# Patient Record
Sex: Female | Born: 1990 | Race: White | Hispanic: Yes | Marital: Married | Smoking: Never smoker
Health system: Southern US, Community
[De-identification: ages and names within clinical notes are randomized; demographics above are authoritative.]

## PROBLEM LIST (undated history)

## (undated) ENCOUNTER — Inpatient Hospital Stay (HOSPITAL_COMMUNITY): Payer: Self-pay

## (undated) DIAGNOSIS — Z789 Other specified health status: Secondary | ICD-10-CM

---

## 2016-12-06 DIAGNOSIS — Z98891 History of uterine scar from previous surgery: Secondary | ICD-10-CM | POA: Insufficient documentation

## 2017-07-23 NOTE — L&D Delivery Note (Addendum)
Delivery Note At 10:38 PM a viable and healthy female was delivered via Vaginal, Spontaneous (Presentation: ROA).  APGAR: 9 ,9 ; weight pending.   Placenta status: spontaneous,intact .  Cord: 3 vessel with the following complications: none .    Anesthesia:  none Episiotomy: None Lacerations:  intact Suture Repair: n/a Est. Blood Loss (mL):  150 ml  Mom to postpartum.  Baby to Couplet care / Skin to Skin.  Sally Peterson is a 27 y.o. female [phone] with IUP at 1017w5d admitted for active labor at term.  She progressed without augmentation to complete and pushed less than 30 minutes to deliver.  While pushing, pt temp was 101.0. IV fluid bolus given and delivery occurred within a few minutes of maternal temp.  Cord clamping delayed by 1 minute then clamped by CNM and cut by family member.  Placenta intact and spontaneous, bleeding minimal. Intact perineum.  Mom and baby stable prior to transfer to postpartum. She plans on breastfeeding. She requests Nexplanon for birth control.   Sharen CounterLisa Leftwich-Kirby 02/24/2018, 10:55 PM

## 2017-09-01 ENCOUNTER — Emergency Department (HOSPITAL_COMMUNITY): Payer: Self-pay

## 2017-09-01 ENCOUNTER — Emergency Department (HOSPITAL_COMMUNITY)
Admission: EM | Admit: 2017-09-01 | Discharge: 2017-09-02 | Disposition: A | Discharge status: Home / Self Care | Payer: Self-pay | Attending: Physician Assistant | Admitting: Physician Assistant

## 2017-09-01 ENCOUNTER — Encounter (HOSPITAL_COMMUNITY): Payer: Self-pay | Admitting: Emergency Medicine

## 2017-09-01 DIAGNOSIS — O9989 Other specified diseases and conditions complicating pregnancy, childbirth and the puerperium: Secondary | ICD-10-CM | POA: Insufficient documentation

## 2017-09-01 DIAGNOSIS — E876 Hypokalemia: Secondary | ICD-10-CM | POA: Insufficient documentation

## 2017-09-01 DIAGNOSIS — Z3A14 14 weeks gestation of pregnancy: Secondary | ICD-10-CM | POA: Insufficient documentation

## 2017-09-01 DIAGNOSIS — R1030 Lower abdominal pain, unspecified: Secondary | ICD-10-CM | POA: Insufficient documentation

## 2017-09-01 DIAGNOSIS — N898 Other specified noninflammatory disorders of vagina: Secondary | ICD-10-CM | POA: Insufficient documentation

## 2017-09-01 DIAGNOSIS — O99282 Endocrine, nutritional and metabolic diseases complicating pregnancy, second trimester: Secondary | ICD-10-CM | POA: Insufficient documentation

## 2017-09-01 LAB — COMPREHENSIVE METABOLIC PANEL
ALBUMIN: 3.7 g/dL (ref 3.5–5.0)
ALT: 14 U/L (ref 14–54)
AST: 21 U/L (ref 15–41)
Alkaline Phosphatase: 60 U/L (ref 38–126)
Anion gap: 12 (ref 5–15)
BILIRUBIN TOTAL: 0.4 mg/dL (ref 0.3–1.2)
CALCIUM: 8.8 mg/dL — AB (ref 8.9–10.3)
CO2: 20 mmol/L — ABNORMAL LOW (ref 22–32)
Chloride: 104 mmol/L (ref 101–111)
Creatinine, Ser: 0.46 mg/dL (ref 0.44–1.00)
GFR calc Af Amer: >60 mL/min (ref >60)
GFR calc non Af Amer: >60 mL/min (ref >60)
GLUCOSE: 90 mg/dL (ref 65–99)
Potassium: 3.1 mmol/L — ABNORMAL LOW (ref 3.5–5.1)
Sodium: 136 mmol/L (ref 135–145)
TOTAL PROTEIN: 7.1 g/dL (ref 6.5–8.1)

## 2017-09-01 LAB — OB RESULTS CONSOLE HGB/HCT, BLOOD
HCT: 35
HEMOGLOBIN: 11.8

## 2017-09-01 LAB — URINALYSIS, ROUTINE W REFLEX MICROSCOPIC
BILIRUBIN URINE: NEGATIVE
Glucose, UA: NEGATIVE mg/dL
HGB URINE DIPSTICK: NEGATIVE
KETONES UR: 80 mg/dL — AB
Leukocytes, UA: NEGATIVE
NITRITE: NEGATIVE
PH: 5 (ref 5.0–8.0)
Protein, ur: NEGATIVE mg/dL
Specific Gravity, Urine: 1.018 (ref 1.005–1.030)

## 2017-09-01 LAB — LIPASE, BLOOD: Lipase: 23 U/L (ref 11–51)

## 2017-09-01 LAB — CBC
HEMATOCRIT: 35.3 % — AB (ref 36.0–46.0)
Hemoglobin: 11.8 g/dL — ABNORMAL LOW (ref 12.0–15.0)
MCH: 29 pg (ref 26.0–34.0)
MCHC: 33.4 g/dL (ref 30.0–36.0)
MCV: 86.7 fL (ref 78.0–100.0)
Platelets: 316 10*3/uL (ref 150–400)
RBC: 4.07 MIL/uL (ref 3.87–5.11)
RDW: 13.4 % (ref 11.5–15.5)
WBC: 11.5 10*3/uL — ABNORMAL HIGH (ref 4.0–10.5)

## 2017-09-01 LAB — HCG, QUANTITATIVE, PREGNANCY: hCG, Beta Chain, Quant, S: 47683 m[IU]/mL — ABNORMAL HIGH (ref <5)

## 2017-09-01 LAB — OB RESULTS CONSOLE PLATELET COUNT: PLATELETS: 316

## 2017-09-01 MED ORDER — SODIUM CHLORIDE 0.9 % IV BOLUS (SEPSIS)
500.0000 mL | Freq: Once | INTRAVENOUS | Status: AC
  Administered 2017-09-01: 500 mL via INTRAVENOUS

## 2017-09-01 MED ORDER — POTASSIUM CHLORIDE CRYS ER 20 MEQ PO TBCR
40.0000 meq | EXTENDED_RELEASE_TABLET | Freq: Once | ORAL | Status: AC
  Administered 2017-09-01: 40 meq via ORAL
  Filled 2017-09-01: qty 2

## 2017-09-01 NOTE — ED Provider Notes (Signed)
[facility] EMERGENCY DEPARTMENT Provider Note   CSN: [id number] Arrival date & time: 09/01/17  2019     History   Chief Complaint Chief Complaint  Patient presents with  . Abdominal Pain    HPI Sally Peterson is a 27 y.o. female [phone] with no past medical history presenting with 1 day of lower abdominal cramping bilaterally wrapping around her back with associated nausea vomiting today.  Patient reports a home pregnancy positive test at home .  LMP was 05/06/17.  She has not yet had her initial OB visit.  She just moved to [facility] and has an appointment scheduled on 09/10/17 She denies any vaginal bleeding or  discharge.  No focal lateral pain.  Describes the pain similar to menstrual cramps.  Denies any fever, chills.  HPI  History reviewed. No pertinent past medical history.  There are no active problems to display for this patient.   Past Surgical History:  Procedure Laterality Date  . CESAREAN SECTION      OB History    Gravida Para Term Preterm AB Living   1             SAB TAB Ectopic Multiple Live Births                   Home Medications    Prior to Admission medications   Medication Sig Start Date End Date Taking? Authorizing Provider  Prenatal Vit-Fe Fumarate-FA (PRENATAL COMPLETE) 14-0.4 MG TABS Take 1 capsule by mouth daily. 09/02/17   Georgiana Shore, PA-C    Family History No family history on file.  Social History Social History   Tobacco Use  . Smoking status: Never Smoker  . Smokeless tobacco: Never Used  Substance Use Topics  . Alcohol use: No    Frequency: Never  . Drug use: No     Allergies   Penicillins   Review of Systems Review of Systems  Constitutional: Negative for chills, diaphoresis, fatigue and fever.  Respiratory: Negative for cough, chest tightness, shortness of breath, wheezing and stridor.   Cardiovascular: Negative for chest pain and palpitations.  Gastrointestinal: Positive  for nausea and vomiting. Negative for abdominal pain, blood in stool and diarrhea.  Genitourinary: Positive for pelvic pain. Negative for difficulty urinating, dysuria, frequency, hematuria, urgency, vaginal bleeding, vaginal discharge and vaginal pain.       She reports a pressure sensation after she urinates sensitive baby was pushing down  Musculoskeletal: Negative for arthralgias, back pain, myalgias, neck pain and neck stiffness.  Skin: Negative for color change, pallor and rash.  Neurological: Negative for dizziness, seizures, syncope, weakness, light-headedness and headaches.     Physical Exam Updated Vital Signs BP 129/75   Pulse (!) 108   Temp 97.9 F (36.6 C) (Oral)   Resp 16   Ht 5\' 2"  (1.575 m)   Wt 49.9 kg (110 lb)   LMP 06/06/2017   SpO2 100%   BMI 20.12 kg/m   Physical Exam  Constitutional: She appears well-developed and well-nourished.  Non-toxic appearance. She does not appear ill. No distress.  Well-appearing, nontoxic afebrile sitting comfortably in bed no acute distress.  HENT:  Head: Normocephalic and atraumatic.  Eyes: Conjunctivae are normal.  Neck: Neck supple.  Cardiovascular: Normal rate and regular rhythm.  No murmur heard. Pulmonary/Chest: Effort normal and breath sounds normal. No stridor. No respiratory distress. She has no wheezes. She has no rales. She exhibits no tenderness.  Abdominal: Soft. Normal appearance and  bowel sounds are normal. There is tenderness. There is no rigidity, no rebound, no guarding, no CVA tenderness and no tenderness at McBurney's point.  Mild discomfort to palpation of the suprapubic area and lower abdomen bilaterally  Genitourinary: Vaginal discharge found.  Genitourinary Comments: Os is closed. Copious milky white discharge and cervix mildly friable. No cervical motion tenderness  Musculoskeletal: She exhibits no edema.  Neurological: She is alert.  Skin: Skin is warm and dry. No rash noted. She is not diaphoretic. No  cyanosis or erythema. No pallor.  Psychiatric: She has a normal mood and affect.  Nursing note and vitals reviewed.    ED Treatments / Results  Labs (all labs ordered are listed, but only abnormal results are displayed) Labs Reviewed  COMPREHENSIVE METABOLIC PANEL - Abnormal; Notable for the following components:      Result Value   Potassium 3.1 (*)    CO2 20 (*)    BUN <5 (*)    Calcium 8.8 (*)    All other components within normal limits  CBC - Abnormal; Notable for the following components:   WBC 11.5 (*)    Hemoglobin 11.8 (*)    HCT 35.3 (*)    All other components within normal limits  URINALYSIS, ROUTINE W REFLEX MICROSCOPIC - Abnormal; Notable for the following components:   Ketones, ur 80 (*)    All other components within normal limits  HCG, QUANTITATIVE, PREGNANCY - Abnormal; Notable for the following components:   hCG, Beta Chain, Quant, [place] 16,109 (*)    All other components within normal limits  WET PREP, GENITAL  LIPASE, BLOOD  GC/CHLAMYDIA PROBE AMP ([place]) NOT AT [facility]    EKG  EKG Interpretation None       Radiology US Ob Limited > 14 Wks  Result Date: 09/01/2017 CLINICAL DATA:  27 y/o  F; lower abdominal pain. EXAM: LIMITED OBSTETRIC ULTRASOUND FINDINGS: Number of Fetuses: 1 Heart Rate:  158 bpm Movement: Yes Presentation: Breech Placental Location: Posterior Previa: No Amniotic Fluid (Subjective):  Within normal limits. BPD:  2.61cm 14w 4d MATERNAL FINDINGS: Cervix:  Appears closed. Uterus/Adnexae: Ovaries not visualized. No acute process identified. IMPRESSION: Single live intrauterine pregnancy. Estimated gestational age [redacted] weeks 4 days. No acute process identified. This exam is performed on an emergent basis and does not comprehensively evaluate fetal size, dating, or anatomy; follow-up complete OB US should be considered if further fetal assessment is warranted. Electronically Signed   By: Mitzi Hansen M.D.   On: 09/01/2017 23:25     Procedures Procedures (including critical care time)   Medications Ordered in ED Medications  sodium chloride 0.9 % bolus 500 mL (0 mLs Intravenous Stopped 09/02/17 0051)  potassium chloride SA (K-DUR,KLOR-CON) CR tablet 40 mEq (40 mEq Oral Given 09/01/17 2330)     Initial Impression / Assessment and Plan / ED Course  I have reviewed the triage vital signs and the nursing notes.  Pertinent labs & imaging results that were available during my care of the patient were reviewed by me and considered in my medical decision making (see chart for details).    Patient presenting with sudden onset lower abdominal cramping this morning with nausea/vomiting.  Fetal heart tones by Doppler above 150 on my exam. hcg quant within normal range for EGA  Ordered labs and ultrasound Patient was stable and comfortable, will reassess  Hypokalemia given potassium in the ED.  U/A with mild signs of dehydration. Ordered fluids. On reassessment, patient reported significant improvement.  She stated that her pain had also improved. No vomiting since this morning.  Tolerating p.o.  Ultrasound showing single live intrauterine pregnancy with heartbeat and estimated at 14 weeks 4 days, no other acute processes.  Pelvic exam with copious white discharge and mild friability of the cervix no cervical motion tenderness.  Wetprep negative. Patient overall improved in the ED.  Discharge home with symptomatic relief and close follow-up with OB/GYN.  Patient has an appointment scheduled.  Discussed strict return precautions and advised to return to the emergency department if experiencing any new or worsening symptoms. Instructions were understood and patient agreed with discharge plan.  Final Clinical Impressions(s) / ED Diagnoses   Final diagnoses:  Lower abdominal pain  Hypokalemia    ED Discharge Orders        Ordered    Prenatal Vit-Fe Fumarate-FA (PRENATAL COMPLETE) 14-0.4 MG TABS  Daily      09/02/17 0050       Georgiana ShoreMitchell, Jessica B, PA-C 09/02/17 0135    Abelino DerrickMackuen, Courteney Lyn, MD 09/05/17 0100

## 2017-09-01 NOTE — ED Notes (Signed)
05/06/2017 LMP

## 2017-09-01 NOTE — ED Triage Notes (Signed)
Pt reports lower abd pain onset this AM, cramping, intermittent, 8/10. Also reports N/V. Pt states she is pregnant, LMP 06/06/17. Pt has not had OBGYN appt, no EDD.

## 2017-09-01 NOTE — ED Notes (Signed)
Pt back from US

## 2017-09-02 LAB — WET PREP, GENITAL
CLUE CELLS WET PREP: NONE SEEN
Sperm: NONE SEEN
Trich, Wet Prep: NONE SEEN
YEAST WET PREP: NONE SEEN

## 2017-09-02 LAB — GC/CHLAMYDIA PROBE AMP (~~LOC~~) NOT AT ARMC
CHLAMYDIA, DNA PROBE: NEGATIVE
NEISSERIA GONORRHEA: NEGATIVE

## 2017-09-02 LAB — OB RESULTS CONSOLE GC/CHLAMYDIA: GC PROBE AMP, GENITAL: NEGATIVE

## 2017-09-02 MED ORDER — PRENATAL COMPLETE 14-0.4 MG PO TABS: 1.0000 | ORAL_TABLET | Freq: Every day | ORAL | 0 refills | Status: AC

## 2017-09-02 NOTE — ED Notes (Signed)
Pelvic exam done. No complaints.

## 2017-09-02 NOTE — Discharge Instructions (Signed)
As discussed, your ultrasound showed a single live intrauterine pregnancy. Estimated at 14 weeks 4 days. make sure to follow up with your OB/GYN. Drink plenty of fluids to maintain hydration and take prenatal vitamins.  Return if symptoms worsen, pain, vomiting, fever, chills or other new concerning symptoms in the meantime.

## 2017-11-11 LAB — OB RESULTS CONSOLE ABO/RH: RH Type: POSITIVE

## 2017-11-11 LAB — URINE CULTURE
Cystic Fibrosis Profile: NEGATIVE
Drug Screen, Urine: NEGATIVE
Glucose 1 Hour: 91
URINE CULTURE, OB: NO GROWTH

## 2017-11-11 LAB — OB RESULTS CONSOLE HIV ANTIBODY (ROUTINE TESTING): HIV: NONREACTIVE

## 2017-11-11 LAB — OB RESULTS CONSOLE RUBELLA ANTIBODY, IGM: Rubella: IMMUNE

## 2017-11-11 LAB — OB RESULTS CONSOLE RPR: RPR: NONREACTIVE

## 2017-11-11 LAB — OB RESULTS CONSOLE HEPATITIS B SURFACE ANTIGEN: HEP B S AG: NEGATIVE

## 2017-11-11 LAB — OB RESULTS CONSOLE ANTIBODY SCREEN: ANTIBODY SCREEN: NEGATIVE

## 2017-11-19 LAB — OB RESULTS CONSOLE VARICELLA ZOSTER ANTIBODY, IGG: VARICELLA IGG: IMMUNE

## 2017-11-19 LAB — URINE CULTURE: Pap: NEGATIVE

## 2017-12-13 ENCOUNTER — Encounter: Payer: Self-pay | Admitting: *Deleted

## 2017-12-18 ENCOUNTER — Encounter: Payer: Self-pay | Admitting: *Deleted

## 2017-12-19 ENCOUNTER — Encounter: Payer: Self-pay | Admitting: *Deleted

## 2017-12-19 DIAGNOSIS — R7611 Nonspecific reaction to tuberculin skin test without active tuberculosis: Secondary | ICD-10-CM | POA: Insufficient documentation

## 2017-12-19 DIAGNOSIS — O365990 Maternal care for other known or suspected poor fetal growth, unspecified trimester, not applicable or unspecified: Secondary | ICD-10-CM | POA: Insufficient documentation

## 2017-12-19 DIAGNOSIS — O0990 Supervision of high risk pregnancy, unspecified, unspecified trimester: Secondary | ICD-10-CM | POA: Insufficient documentation

## 2017-12-20 ENCOUNTER — Encounter: Payer: Self-pay | Admitting: Family Medicine

## 2017-12-20 ENCOUNTER — Ambulatory Visit (INDEPENDENT_AMBULATORY_CARE_PROVIDER_SITE_OTHER): Payer: Self-pay | Admitting: Family Medicine

## 2017-12-20 VITALS — BP 112/71 | HR 86 | Wt 117.6 lb

## 2017-12-20 DIAGNOSIS — Z98891 History of uterine scar from previous surgery: Secondary | ICD-10-CM | POA: Insufficient documentation

## 2017-12-20 DIAGNOSIS — O09213 Supervision of pregnancy with history of pre-term labor, third trimester: Secondary | ICD-10-CM

## 2017-12-20 DIAGNOSIS — O365930 Maternal care for other known or suspected poor fetal growth, third trimester, not applicable or unspecified: Secondary | ICD-10-CM

## 2017-12-20 DIAGNOSIS — O0990 Supervision of high risk pregnancy, unspecified, unspecified trimester: Secondary | ICD-10-CM

## 2017-12-20 LAB — POCT URINALYSIS DIP (DEVICE)
Bilirubin Urine: NEGATIVE
GLUCOSE, UA: NEGATIVE mg/dL
Hgb urine dipstick: NEGATIVE
KETONES UR: NEGATIVE mg/dL
NITRITE: NEGATIVE
PH: 7 (ref 5.0–8.0)
PROTEIN: 30 mg/dL — AB
Specific Gravity, Urine: 1.02 (ref 1.005–1.030)
UROBILINOGEN UA: 0.2 mg/dL (ref 0.0–1.0)

## 2017-12-20 NOTE — Progress Notes (Signed)
Here for initial prenatal - transferring from [facility].States was on ocp's when got pregnant. Given new patient packet. Declined babyscripts app today because does not have email; but may get email and sign up next visit. Also will sign up for Mychart after she has email.

## 2017-12-20 NOTE — Progress Notes (Signed)
   PRENATAL VISIT NOTE  Subjective:  Sally Peterson is a 27 y.o. [phone] at 2567w2d being seen today for initial prenatal care. [facility] her due to IUGR baby. She is currently monitored for the following issues for this high-risk pregnancy and has IUGR (intrauterine growth restriction) affecting care of mother; Supervision of high risk pregnancy, antepartum; Positive TB test; Previous preterm delivery, antepartum, third trimester; and Previous cesarean section on their problem list.  Patient reports no complaints.  Contractions: Not present. Vag. Bleeding: None.  Movement: Present. Denies leaking of fluid.   The following portions of the patient's history were reviewed and updated as appropriate: allergies, current medications, past family history, past medical history, past social history, past surgical history and problem list. Problem list updated.  Objective:   Vitals:   12/20/17 0915  BP: 112/71  Pulse: 86  Weight: 117 lb 9.6 oz (53.3 kg)    Fetal Status: Fetal Heart Rate (bpm): 156   Movement: Present     General:  Alert, oriented and cooperative. Patient is in no acute distress.  Skin: Skin is warm and dry. No rash noted.   Cardiovascular: Normal heart rate noted  Respiratory: Normal respiratory effort, no problems with respiration noted  Abdomen: Soft, gravid, appropriate for gestational age.  Pain/Pressure: Absent     Pelvic: Cervical exam deferred        Extremities: Normal range of motion.  Edema: None  Mental Status: Normal mood and affect. Normal behavior. Normal judgment and thought content.   Assessment and Plan:  Pregnancy: G3P1102 at 4267w2d  1. Supervision of high risk pregnancy, antepartum FHT and FH normal  2. Poor fetal growth affecting management of mother in third trimester, single or unspecified fetus Repeat US - US MFM OB DETAIL +14 WK; Future  3. Previous preterm delivery, antepartum, third trimester At 30 wks, does not  qualify for makena  4. Previous cesarean section With successful VBAC  Preterm labor symptoms and general obstetric precautions including but not limited to vaginal bleeding, contractions, leaking of fluid and fetal movement were reviewed in detail with the patient. Please refer to After Visit Summary for other counseling recommendations.  Return in about 2 weeks (around 01/03/2018) for HR OB f/u.  No future appointments.  Levie HeritageJacob J Kebron Pulse, DO

## 2017-12-26 ENCOUNTER — Encounter (HOSPITAL_COMMUNITY): Payer: Self-pay

## 2017-12-26 ENCOUNTER — Other Ambulatory Visit: Payer: Self-pay | Admitting: Family Medicine

## 2017-12-26 ENCOUNTER — Ambulatory Visit (HOSPITAL_COMMUNITY)
Admission: RE | Admit: 2017-12-26 | Discharge: 2017-12-26 | Disposition: A | Discharge status: Home / Self Care | Payer: Self-pay | Source: Ambulatory Visit | Attending: Family Medicine | Admitting: Family Medicine

## 2017-12-26 DIAGNOSIS — O365930 Maternal care for other known or suspected poor fetal growth, third trimester, not applicable or unspecified: Secondary | ICD-10-CM

## 2017-12-26 DIAGNOSIS — O09213 Supervision of pregnancy with history of pre-term labor, third trimester: Secondary | ICD-10-CM

## 2017-12-26 DIAGNOSIS — Z363 Encounter for antenatal screening for malformations: Secondary | ICD-10-CM

## 2017-12-26 DIAGNOSIS — Z3A31 31 weeks gestation of pregnancy: Secondary | ICD-10-CM

## 2017-12-26 DIAGNOSIS — R7611 Nonspecific reaction to tuberculin skin test without active tuberculosis: Secondary | ICD-10-CM

## 2017-12-26 DIAGNOSIS — O0990 Supervision of high risk pregnancy, unspecified, unspecified trimester: Secondary | ICD-10-CM

## 2017-12-26 HISTORY — DX: Other specified health status: Z78.9

## 2017-12-27 ENCOUNTER — Other Ambulatory Visit (HOSPITAL_COMMUNITY): Payer: Self-pay | Admitting: *Deleted

## 2017-12-27 DIAGNOSIS — O365990 Maternal care for other known or suspected poor fetal growth, unspecified trimester, not applicable or unspecified: Secondary | ICD-10-CM

## 2018-01-02 ENCOUNTER — Ambulatory Visit (HOSPITAL_COMMUNITY)
Admission: RE | Admit: 2018-01-02 | Discharge: 2018-01-02 | Disposition: A | Discharge status: Home / Self Care | Payer: Self-pay | Source: Ambulatory Visit | Attending: Family Medicine | Admitting: Family Medicine

## 2018-01-02 ENCOUNTER — Encounter (HOSPITAL_COMMUNITY): Payer: Self-pay

## 2018-01-02 DIAGNOSIS — Z3A32 32 weeks gestation of pregnancy: Secondary | ICD-10-CM | POA: Insufficient documentation

## 2018-01-02 DIAGNOSIS — O365990 Maternal care for other known or suspected poor fetal growth, unspecified trimester, not applicable or unspecified: Secondary | ICD-10-CM

## 2018-01-02 DIAGNOSIS — O365930 Maternal care for other known or suspected poor fetal growth, third trimester, not applicable or unspecified: Secondary | ICD-10-CM | POA: Insufficient documentation

## 2018-01-08 ENCOUNTER — Ambulatory Visit (INDEPENDENT_AMBULATORY_CARE_PROVIDER_SITE_OTHER): Payer: Self-pay | Admitting: Obstetrics & Gynecology

## 2018-01-08 VITALS — BP 118/64 | HR 90 | Wt 121.7 lb

## 2018-01-08 DIAGNOSIS — O0990 Supervision of high risk pregnancy, unspecified, unspecified trimester: Secondary | ICD-10-CM

## 2018-01-08 DIAGNOSIS — O09213 Supervision of pregnancy with history of pre-term labor, third trimester: Secondary | ICD-10-CM

## 2018-01-08 DIAGNOSIS — Z98891 History of uterine scar from previous surgery: Secondary | ICD-10-CM

## 2018-01-08 DIAGNOSIS — R7611 Nonspecific reaction to tuberculin skin test without active tuberculosis: Secondary | ICD-10-CM

## 2018-01-08 DIAGNOSIS — O0993 Supervision of high risk pregnancy, unspecified, third trimester: Secondary | ICD-10-CM

## 2018-01-08 DIAGNOSIS — O365910 Maternal care for other known or suspected poor fetal growth, first trimester, not applicable or unspecified: Secondary | ICD-10-CM

## 2018-01-08 NOTE — Patient Instructions (Addendum)
Third Trimester of Pregnancy The third trimester is from week 29 through week 42, months 7 through 9. This trimester is when your unborn baby (fetus) is growing very fast. At the end of the ninth month, the unborn baby is about 20 inches in length. It weighs about 6-10 pounds. Follow these instructions at home:  Avoid all smoking, herbs, and alcohol. Avoid drugs not approved by your doctor.  Do not use any tobacco products, including cigarettes, chewing tobacco, and electronic cigarettes. If you need help quitting, ask your doctor. You may get counseling or other support to help you quit.  Only take medicine as told by your doctor. Some medicines are safe and some are not during pregnancy.  Exercise only as told by your doctor. Stop exercising if you start having cramps.  Eat regular, healthy meals.  Wear a good support bra if your breasts are tender.  Do not use hot tubs, steam rooms, or saunas.  Wear your seat belt when driving.  Avoid raw meat, uncooked cheese, and liter boxes and soil used by cats.  Take your prenatal vitamins.  Take 1500-2000 milligrams of calcium daily starting at the 20th week of pregnancy until you deliver your baby.  Try taking medicine that helps you poop (stool softener) as needed, and if your doctor approves. Eat more fiber by eating fresh fruit, vegetables, and whole grains. Drink enough fluids to keep your pee (urine) clear or pale yellow.  Take warm water baths (sitz baths) to soothe pain or discomfort caused by hemorrhoids. Use hemorrhoid cream if your doctor approves.  If you have puffy, bulging veins (varicose veins), wear support hose. Raise (elevate) your feet for 15 minutes, 3-4 times a day. Limit salt in your diet.  Avoid heavy lifting, wear low heels, and sit up straight.  Rest with your legs raised if you have leg cramps or low back pain.  Visit your dentist if you have not gone during your pregnancy. Use a soft toothbrush to brush your  teeth. Be gentle when you floss.  You can have sex (intercourse) unless your doctor tells you not to.  Do not travel far distances unless you must. Only do so with your doctor's approval.  Take prenatal classes.  Practice driving to the hospital.  Pack your hospital bag.  Prepare the baby's room.  Go to your doctor visits. Get help if:  You are not sure if you are in labor or if your water has broken.  You are dizzy.  You have mild cramps or pressure in your lower belly (abdominal).  You have a nagging pain in your belly area.  You continue to feel sick to your stomach (nauseous), throw up (vomit), or have watery poop (diarrhea).  You have bad smelling fluid coming from your vagina.  You have pain with peeing (urination). Get help right away if:  You have a fever.  You are leaking fluid from your vagina.  You are spotting or bleeding from your vagina.  You have severe belly cramping or pain.  You lose or gain weight rapidly.  You have trouble catching your breath and have chest pain.  You notice sudden or extreme puffiness (swelling) of your face, hands, ankles, feet, or legs.  You have not felt the baby move in over an hour.  You have severe headaches that do not go away with medicine.  You have vision changes. This information is not intended to replace advice given to you by your health care provider. Make   sure you discuss any questions you have with your health care provider. Document Released: 10/03/2009 Document Revised: 12/15/2015 Document Reviewed: 09/09/2012 Elsevier Interactive Patient Education  2017 Elsevier Inc. Parto vaginal despus de Eustace Quailuna cesrea (Vaginal Birth After Cesarean Delivery) Un parto vaginal despus de un parto por cesrea es dar a luz por la vagina despus de haber dado a luz por medio de una intervencin Barbadosquirrgica. En el pasado, si una mujer tena un beb por cesrea, todos los partos posteriores deban hacerse por cesrea. Esto ya  no es as. Puede ser seguro para la mam intentar un parto vaginal luego de una cesrea. Es importante que converse con su mdico desde comienzos del Psychiatristembarazo de modo que pueda Googlecomprender los riesgos, beneficios y opciones. Le dar tiempo para decidir qu es lo mejor en su caso particular. La decisin final de tener un parto vaginal o por cesrea debe tomarse en conjunto, entre usted y el mdico. Cualquier cambio en su salud o la de su beb durante el embarazo puede ser motivo de un cambio de decisin respecto del parto vaginal. LAS MUJERES QUE OPTAN POR EL PARTO VAGINAL, DEBEN CONSULTAR AL MDICO PARA ASEGURARSE DE QUE:  La cesrea anterior se haya realizado con un corte (incisin) uterino transversal (no con una incisin vertical clsica).  El canal de parto es lo suficientemente grande como para que pase el Ortleynio.  No ha sido sometida a otras operaciones del tero.  Durante el trabajo de parto, le realizarn un monitoreo fetal Forensic scientistelectrnico, en todo momento.  Habr un quirfano disponible y listo en caso de necesitar una cesrea de emergencia.  Un mdico y personal de quirfano estarn disponibles en todo momento durante el Shady Springtrabajo de parto, para realizar una cesrea en caso de ser necesario.  Habr un anestesista disponible en caso de necesitar una cesrea de emergencia.  La nursery est lista y cuenta con personal especializado y el equipo disponible para cuidar al beb en caso de emergencia. BENEFICIOS DEL PARTO VAGINAL:  Permanencia ms breve en el hospital.  Prevencin de los riesgos asociados con el parto por cesrea, por ejemplo: ? Complicaciones quirrgicas, como apertura o hernia de la incisin. ? Lesiones en otros rganos. ? Fiebre. Esto puede ocurrir si aparece una infeccin despus de la ciruga. Tambin puede ocurrir como reaccin a los medicamentos administrados para adormecerla durante la Azerbaijanciruga.  Menos prdida de sangre y menos probabilidad de necesitar una transfusin  sangunea.  Menor riesgo de cogulos sanguneos e infeccin.  Tiempo ms corto de recuperacin.  Menor riesgo de remocin del tero (histerectoma).  Menor riesgo de que la placenta cubra parcial o completamente la abertura del tero (placenta previa) en embarazos futuros.  Menos riesgos en el Rositatrabajo de parto y [place] parto futuros. RIESGOS  Ruptura del tero. Esto ocurre en menos del 1% de los partos vaginales. El riesgo de que eso suceda es mayor si: ? Se toman medidas para iniciar el proceso del trabajo de parto (inducir Engineer, manufacturing systemsel parto) o Risk managerestimular o intensificar las contracciones (aumentar el trabajo de [place] Parkparto). ? Se usan medicamentos para ablandar (madurar) el cuello del tero.  Es necesario extraer el tero (histerectoma) si se rompe. No debe llevarse a cabo si:  La cesrea previa se realiz con una incisin vertical (clsica) o con forma de T, o usted no sabe cul de Lucent Technologiesellas le han practicado.  Ha sufrido ruptura del tero.  Ha tenido ciertos tipos de Leisure centre managerciruga en el tero, como la extirpacin de fibromas uterinos. Pregntele a su mdico  sobre otros tipos de Financial risk analyst que le impiden tener un parto vaginal.  Tiene ciertos problemas mdicos o relacionados con el parto (obsttricos).  El beb est en problemas.  Tuvo dos cesreas previas y ningn parto vaginal. OTRAS COSAS QUE DEBE SABER:  La anestesia peridural es segura.  Es seguro dar vuelta al beb si se encuentra de nalgas (intentar una versin ceflica externa).  Es seguro intentarlo en caso de mellizos.  El parto vaginal puede no ser apropiado si el beb pesa 8,8lb (4kg) o ms. Sin embargo, las predicciones de [place] no son siempre exactas y no deben ser lo nico a tenerse en cuenta para decidir si el parto vaginal es lo indicado para usted.  Hay aumento en el porcentaje de fracasos si el intervalo entre la cesrea y el parto vaginal es de menos de 19 meses.  Su mdico puede aconsejarle no tener un parto vaginal si tiene  preeclampsia (hipertensin, protena en la orina e hinchazn en la cara y las extremidades).  El parto vaginal suele ser exitoso si ya tuvo un parto vaginal previamente.  Tambin suele ser exitoso cuando el trabajo de parto comienza espontneamente antes de la fecha.  El parto vaginal despus de Eustace Quail es similar a un parto espontneo vaginal normal. Esta informacin no tiene Theme park manager el consejo del mdico. Asegrese de hacerle al mdico cualquier pregunta que tenga. Document Released: 12/26/2007 Document Revised: 04/29/2013 Document Reviewed: 02/05/2013 Elsevier Interactive Patient Education  [employer].

## 2018-01-08 NOTE — Progress Notes (Signed)
   PRENATAL VISIT NOTE  Subjective:  Sally Peterson is a 27 y.o. [id number] at 6428w0d being seen today for ongoing prenatal care.  She is currently monitored for the following issues for this high-risk pregnancy and has IUGR (intrauterine growth restriction) affecting care of mother; Supervision of high risk pregnancy, antepartum; Positive TB test; Previous preterm delivery, antepartum, third trimester; and Previous cesarean section on their problem list.  Patient reports no complaints.  Contractions: Irregular. Vag. Bleeding: None.  Movement: Present. Denies leaking of fluid.   The following portions of the patient's history were reviewed and updated as appropriate: allergies, current medications, past family history, past medical history, past social history, past surgical history and problem list. Problem list updated.  Objective:   Vitals:   01/08/18 1343  BP: 118/64  Pulse: 90  Weight: 121 lb 11.2 oz (55.2 kg)    Fetal Status: Fetal Heart Rate (bpm): 143   Movement: Present     General:  Alert, oriented and cooperative. Patient is in no acute distress.  Skin: Skin is warm and dry. No rash noted.   Cardiovascular: Normal heart rate noted  Respiratory: Normal respiratory effort, no problems with respiration noted  Abdomen: Soft, gravid, appropriate for gestational age.  Pain/Pressure: Present     Pelvic: Cervical exam deferred        Extremities: Normal range of motion.  Edema: None  Mental Status: Normal mood and affect. Normal behavior. Normal judgment and thought content.   Assessment and Plan:  Pregnancy: G3P1102 at 428w0d  1. Supervision of high risk pregnancy, antepartum Weekly BPP doppler  2. Positive TB test   3. Previous preterm delivery, antepartum, third trimester   4. Previous cesarean section   5. Poor fetal growth affecting management of mother in first trimester, single or unspecified fetus F/u growth in one week  Preterm labor symptoms and general  obstetric precautions including but not limited to vaginal bleeding, contractions, leaking of fluid and fetal movement were reviewed in detail with the patient. Please refer to After Visit Summary for other counseling recommendations.  Return in about 2 weeks (around 01/22/2018).  Future Appointments  Date Time Provider Department Center  01/09/2018 10:30 AM [facility] US 1 [facility]  01/16/2018 11:15 AM [facility] US 4 [facility]    Scheryl DarterJames Arnold, MD

## 2018-01-09 ENCOUNTER — Encounter (HOSPITAL_COMMUNITY): Payer: Self-pay

## 2018-01-09 ENCOUNTER — Ambulatory Visit (HOSPITAL_COMMUNITY)
Admission: RE | Admit: 2018-01-09 | Discharge: 2018-01-09 | Disposition: A | Discharge status: Home / Self Care | Payer: Self-pay | Source: Ambulatory Visit | Attending: Family Medicine | Admitting: Family Medicine

## 2018-01-09 DIAGNOSIS — Z3A33 33 weeks gestation of pregnancy: Secondary | ICD-10-CM | POA: Insufficient documentation

## 2018-01-09 DIAGNOSIS — O365990 Maternal care for other known or suspected poor fetal growth, unspecified trimester, not applicable or unspecified: Secondary | ICD-10-CM

## 2018-01-09 DIAGNOSIS — O365930 Maternal care for other known or suspected poor fetal growth, third trimester, not applicable or unspecified: Secondary | ICD-10-CM | POA: Insufficient documentation

## 2018-01-16 ENCOUNTER — Ambulatory Visit (HOSPITAL_COMMUNITY)
Admission: RE | Admit: 2018-01-16 | Discharge: 2018-01-16 | Disposition: A | Discharge status: Home / Self Care | Payer: Self-pay | Source: Ambulatory Visit | Attending: Family Medicine | Admitting: Family Medicine

## 2018-01-16 ENCOUNTER — Encounter (HOSPITAL_COMMUNITY): Payer: Self-pay

## 2018-01-29 ENCOUNTER — Ambulatory Visit (INDEPENDENT_AMBULATORY_CARE_PROVIDER_SITE_OTHER): Payer: Self-pay | Admitting: Certified Nurse Midwife

## 2018-01-29 VITALS — BP 119/72 | HR 100 | Wt 121.0 lb

## 2018-01-29 DIAGNOSIS — Z98891 History of uterine scar from previous surgery: Secondary | ICD-10-CM

## 2018-01-29 DIAGNOSIS — O2613 Low weight gain in pregnancy, third trimester: Secondary | ICD-10-CM

## 2018-01-29 DIAGNOSIS — Z113 Encounter for screening for infections with a predominantly sexual mode of transmission: Secondary | ICD-10-CM

## 2018-01-29 DIAGNOSIS — O2610 Low weight gain in pregnancy, unspecified trimester: Secondary | ICD-10-CM | POA: Insufficient documentation

## 2018-01-29 DIAGNOSIS — O0990 Supervision of high risk pregnancy, unspecified, unspecified trimester: Secondary | ICD-10-CM

## 2018-01-29 DIAGNOSIS — O0993 Supervision of high risk pregnancy, unspecified, third trimester: Secondary | ICD-10-CM

## 2018-01-29 DIAGNOSIS — O09213 Supervision of pregnancy with history of pre-term labor, third trimester: Secondary | ICD-10-CM

## 2018-01-29 DIAGNOSIS — O365930 Maternal care for other known or suspected poor fetal growth, third trimester, not applicable or unspecified: Secondary | ICD-10-CM

## 2018-01-29 NOTE — Progress Notes (Signed)
Subjective:  Sally Peterson is a 27 y.o. [phone] at 1334w0d being seen today for ongoing prenatal care.  She is currently monitored for the following issues for this high-risk pregnancy and has IUGR (intrauterine growth restriction) affecting care of mother; Supervision of high risk pregnancy, antepartum; Positive TB test; Previous preterm delivery, antepartum, third trimester; Previous cesarean section; and Poor weight gain of pregnancy on their problem list.  Patient reports no complaints.  Contractions: Irregular. Vag. Bleeding: None.  Movement: Present. Denies leaking of fluid.   The following portions of the patient's history were reviewed and updated as appropriate: allergies, current medications, past family history, past medical history, past social history, past surgical history and problem list. Problem list updated.  Objective:   Vitals:   01/29/18 1655  BP: 119/72  Pulse: 100  Weight: 121 lb (54.9 kg)    Fetal Status: Fetal Heart Rate (bpm): 155 Fundal Height: 32 cm Movement: Present  Presentation: Vertex  General:  Alert, oriented and cooperative. Patient is in no acute distress.  Skin: Skin is warm and dry. No rash noted.   Cardiovascular: Normal heart rate noted  Respiratory: Normal respiratory effort, no problems with respiration noted  Abdomen: Soft, gravid, appropriate for gestational age. Pain/Pressure: Present     Pelvic: Vag. Bleeding: None     Cervical exam deferred        Extremities: Normal range of motion.  Edema: None  Mental Status: Normal mood and affect. Normal behavior. Normal judgment and thought content.   Urinalysis:      Assessment and Plan:  Pregnancy: G3P1102 at 4034w0d  1. Supervision of high risk pregnancy, antepartum - Strep Gp B Culture+Rflx - GC/Chlamydia probe amp ([place]  2. Previous preterm delivery, antepartum, third trimester  3. Poor fetal growth affecting management of mother in third trimester, single or  unspecified fetus - 31 wks EFW 22%ile, AC 10%ile - 33 wks high normal dopplers, AFI 11.88, BPP 8/8 - missed f/u BPP/dopplers on 01/16/18 - reschedule growth US/BPP/dopplers - NST/AFI tomorrow  4. Previous cesarean section -desires TOLAC, consent signed  5. Poor weight gain in pregnancy -TWG 14 lbs  6. Size dates discrepancy - FH 32cm - growth US next week  Preterm labor symptoms and general obstetric precautions including but not limited to vaginal bleeding, contractions, leaking of fluid and fetal movement were reviewed in detail with the patient. Please refer to After Visit Summary for other counseling recommendations.  Return in about 1 day (around 01/30/2018).  Live interpreter used for encounter Donette LarryBhambri, Laurian Edrington, CNM

## 2018-01-29 NOTE — Progress Notes (Signed)
Per chart review, [facility] - will be rescheduled during visit for NST/AFI here tomorrow

## 2018-01-30 ENCOUNTER — Ambulatory Visit: Payer: Self-pay

## 2018-01-30 ENCOUNTER — Ambulatory Visit (INDEPENDENT_AMBULATORY_CARE_PROVIDER_SITE_OTHER): Payer: Self-pay | Admitting: *Deleted

## 2018-01-30 VITALS — BP 109/73 | HR 101 | Wt 121.9 lb

## 2018-01-30 DIAGNOSIS — O365930 Maternal care for other known or suspected poor fetal growth, third trimester, not applicable or unspecified: Secondary | ICD-10-CM

## 2018-01-30 LAB — OB RESULTS CONSOLE GBS: GBS: NEGATIVE

## 2018-01-30 NOTE — Progress Notes (Signed)
Interpreter Marlynn PerkingMaria Elena present for encounter. Pt advised of indications for fetal testing and plan of care. Pt also informed of US appt @ MFM on 7/19 @ 0945. She was advised to return to hospital immediately if she observes decreased fetal movement. Pt voiced understanding.   Pt informed that the ultrasound is considered a limited OB ultrasound and is not intended to be a complete ultrasound exam.  Patient also informed that the ultrasound is not being completed with the intent of assessing for fetal or placental anomalies or any pelvic abnormalities.  Explained that the purpose of today's ultrasound is to assess for presentation, BPP and amniotic fluid volume.  Patient acknowledges the purpose of the exam and the limitations of the study.

## 2018-01-31 LAB — GC/CHLAMYDIA PROBE AMP (~~LOC~~) NOT AT ARMC
Chlamydia: NEGATIVE
Neisseria Gonorrhea: NEGATIVE

## 2018-02-03 ENCOUNTER — Inpatient Hospital Stay (HOSPITAL_COMMUNITY)
Admission: AD | Admit: 2018-02-03 | Discharge: 2018-02-04 | Disposition: A | Discharge status: Home / Self Care | Payer: Self-pay | Source: Ambulatory Visit | Attending: Obstetrics and Gynecology | Admitting: Obstetrics and Gynecology

## 2018-02-03 DIAGNOSIS — Z0371 Encounter for suspected problem with amniotic cavity and membrane ruled out: Secondary | ICD-10-CM | POA: Insufficient documentation

## 2018-02-03 DIAGNOSIS — O479 False labor, unspecified: Secondary | ICD-10-CM | POA: Insufficient documentation

## 2018-02-03 DIAGNOSIS — Z3A36 36 weeks gestation of pregnancy: Secondary | ICD-10-CM | POA: Insufficient documentation

## 2018-02-03 LAB — STREP GP B CULTURE+RFLX: STREP GP B CULTURE+RFLX: NEGATIVE

## 2018-02-03 NOTE — MAU Note (Signed)
PT SAYS  THINKS  [facility].   NO ENGLISH

## 2018-02-04 ENCOUNTER — Encounter (HOSPITAL_COMMUNITY): Payer: Self-pay | Admitting: *Deleted

## 2018-02-04 DIAGNOSIS — O479 False labor, unspecified: Secondary | ICD-10-CM

## 2018-02-04 LAB — AMNISURE RUPTURE OF MEMBRANE (ROM) NOT AT ARMC: Amnisure ROM: NEGATIVE

## 2018-02-04 LAB — POCT FERN TEST: POCT FERN TEST: NEGATIVE

## 2018-02-04 NOTE — Progress Notes (Signed)
I have communicated with Judeth HornErin Lawrence, NP and reviewed vital signs:  Vitals:   02/03/18 2354 02/04/18 0134  BP: 116/73 128/76  Pulse: 85 90  Resp: 20   Temp: 98 F (36.7 C)     Vaginal exam:  Dilation: 4 Effacement (%): 70 Cervical Position: Posterior Station: -2 Presentation: Vertex Exam by:: A. Sohan Potvin, RN,   Also reviewed contraction pattern and that non-stress test is reactive.  It has been documented that patient is contracting every 3-10 minutes with no cervical change over 1 hour not indicating active labor.  Patient denies any other complaints.  Based on this report provider has given order for discharge.  A discharge order and diagnosis entered by a provider.   Labor discharge instructions reviewed with patient.

## 2018-02-04 NOTE — MAU Provider Note (Signed)
None      S: Ms. Sally Peterson is a 27 y.o. [phone] at 6882w6d  who presents to MAU today complaining of leaking of fluid since this morning. She denies vaginal bleeding. She endorses contractions. She reports normal fetal movement.    O: BP 116/73 (BP Location: Right Arm)   Pulse 85   Temp 98 F (36.7 C) (Oral)   Resp 20   Ht 5' 1.5" (1.562 m)   Wt 120 lb 12 oz (54.8 kg)   LMP 05/06/2017   BMI 22.45 kg/m  GENERAL: Well-developed, well-nourished female in no acute distress.  HEAD: Normocephalic, atraumatic.  CHEST: Normal effort of breathing, regular heart rate ABDOMEN: Soft, nontender, gravid   Cervical exam:  Dilation: 4 Effacement (%): 70 Cervical Position: Posterior Station: -2 Presentation: Vertex Exam by:: AMariel Sleet. Gagliardo, RN   Fetal Monitoring: Baseline: 145 Variability: moderate Accelerations: 15x15 Decelerations: none Contractions: irregular ctx  Results for orders placed or performed during the hospital encounter of 02/03/18 (from the past 24 hour(s))  POCT fern test     Status: None   Collection Time: 02/04/18 12:34 AM  Result Value Ref Range   POCT Fern Test Negative = intact amniotic membranes   Amnisure rupture of membrane (rom)not at [facility]     Status: None   Collection Time: 02/04/18 12:35 AM  Result Value Ref Range   Amnisure ROM NEGATIVE      A: SIUP at 6482w6d  Membranes intact  P: Discharge home  Labor precautions Keep f/u with OB on Thursday  Judeth HornLawrence, Mialani Reicks, NP 02/04/2018 1:32 AM

## 2018-02-04 NOTE — Discharge Instructions (Signed)

## 2018-02-06 ENCOUNTER — Ambulatory Visit (INDEPENDENT_AMBULATORY_CARE_PROVIDER_SITE_OTHER): Payer: Self-pay | Admitting: Obstetrics and Gynecology

## 2018-02-06 ENCOUNTER — Ambulatory Visit (INDEPENDENT_AMBULATORY_CARE_PROVIDER_SITE_OTHER): Payer: Self-pay | Admitting: *Deleted

## 2018-02-06 ENCOUNTER — Encounter: Payer: Self-pay | Admitting: Obstetrics and Gynecology

## 2018-02-06 VITALS — BP 117/69 | HR 79 | Wt 122.0 lb

## 2018-02-06 DIAGNOSIS — O09213 Supervision of pregnancy with history of pre-term labor, third trimester: Secondary | ICD-10-CM

## 2018-02-06 DIAGNOSIS — O0990 Supervision of high risk pregnancy, unspecified, unspecified trimester: Secondary | ICD-10-CM

## 2018-02-06 DIAGNOSIS — O365930 Maternal care for other known or suspected poor fetal growth, third trimester, not applicable or unspecified: Secondary | ICD-10-CM

## 2018-02-06 DIAGNOSIS — Z98891 History of uterine scar from previous surgery: Secondary | ICD-10-CM

## 2018-02-06 NOTE — Progress Notes (Signed)
   PRENATAL VISIT NOTE  Subjective:  Sally Peterson is a 27 y.o. [phone] at 4944w1d being seen today for ongoing prenatal care.  She is currently monitored for the following issues for this high-risk pregnancy and has IUGR (intrauterine growth restriction) affecting care of mother; Supervision of high risk pregnancy, antepartum; Positive TB test; Previous preterm delivery, antepartum, third trimester; Previous cesarean section; and Poor weight gain of pregnancy on their problem list.  Patient reports no complaints.  Contractions: Irregular. Vag. Bleeding: None.  Movement: Present. Denies leaking of fluid.   The following portions of the patient's history were reviewed and updated as appropriate: allergies, current medications, past family history, past medical history, past social history, past surgical history and problem list. Problem list updated.  Objective:   Vitals:   02/06/18 0834  BP: 117/69  Pulse: 79  Weight: 122 lb (55.3 kg)    Fetal Status: Fetal Heart Rate (bpm): 158 Fundal Height: 34 cm Movement: Present     General:  Alert, oriented and cooperative. Patient is in no acute distress.  Skin: Skin is warm and dry. No rash noted.   Cardiovascular: Normal heart rate noted  Respiratory: Normal respiratory effort, no problems with respiration noted  Abdomen: Soft, gravid, appropriate for gestational age.  Pain/Pressure: Present     Pelvic: Cervical exam deferred        Extremities: Normal range of motion.  Edema: None  Mental Status: Normal mood and affect. Normal behavior. Normal judgment and thought content.   Assessment and Plan:  Pregnancy: G3P1102 at 4544w1d  1. Supervision of high risk pregnancy, antepartum Patient is doing well without complaints   2. Previous cesarean section Desires TOLAC  3. Previous preterm delivery, antepartum, third trimester   4. Poor fetal growth affecting management of mother in third trimester, single or unspecified fetus Follow  up growth/BPP today NST reviewed and reactive with baseline 130, mod variability, +accels, no decels  Term labor symptoms and general obstetric precautions including but not limited to vaginal bleeding, contractions, leaking of fluid and fetal movement were reviewed in detail with the patient. Please refer to After Visit Summary for other counseling recommendations.  Return in about 1 week (around 02/13/2018) for as scheduled.  Future Appointments  Date Time Provider Department Center  02/06/2018  9:15 AM [facility] NST [facility]  02/07/2018  9:45 AM [facility]-MFC US 2 WH-MFCUS MFC-US  02/13/2018  1:35 PM Adam PhenixArnold, James G, MD [facility]  02/13/2018  2:15 PM [facility] NST [facility]OC    Catalina AntiguaPeggy Reagan Klemz, MD

## 2018-02-06 NOTE — Progress Notes (Signed)
US for growth, BPP and UA doppler scheduled tomorrow

## 2018-02-07 ENCOUNTER — Encounter (HOSPITAL_COMMUNITY): Payer: Self-pay

## 2018-02-07 ENCOUNTER — Other Ambulatory Visit: Payer: Self-pay | Admitting: Certified Nurse Midwife

## 2018-02-07 ENCOUNTER — Inpatient Hospital Stay (HOSPITAL_COMMUNITY)
Admission: AD | Admit: 2018-02-07 | Discharge: 2018-02-07 | Disposition: A | Discharge status: Home / Self Care | Payer: Self-pay | Source: Ambulatory Visit | Attending: Obstetrics & Gynecology | Admitting: Obstetrics & Gynecology

## 2018-02-07 ENCOUNTER — Ambulatory Visit (HOSPITAL_COMMUNITY)
Admission: RE | Admit: 2018-02-07 | Discharge: 2018-02-07 | Disposition: A | Discharge status: Home / Self Care | Payer: Self-pay | Source: Ambulatory Visit | Attending: Internal Medicine | Admitting: Internal Medicine

## 2018-02-07 DIAGNOSIS — Z362 Encounter for other antenatal screening follow-up: Secondary | ICD-10-CM | POA: Insufficient documentation

## 2018-02-07 DIAGNOSIS — O471 False labor at or after 37 completed weeks of gestation: Secondary | ICD-10-CM | POA: Insufficient documentation

## 2018-02-07 DIAGNOSIS — O365930 Maternal care for other known or suspected poor fetal growth, third trimester, not applicable or unspecified: Secondary | ICD-10-CM

## 2018-02-07 DIAGNOSIS — R7611 Nonspecific reaction to tuberculin skin test without active tuberculosis: Secondary | ICD-10-CM

## 2018-02-07 DIAGNOSIS — O0990 Supervision of high risk pregnancy, unspecified, unspecified trimester: Secondary | ICD-10-CM

## 2018-02-07 DIAGNOSIS — O479 False labor, unspecified: Secondary | ICD-10-CM

## 2018-02-07 DIAGNOSIS — O34219 Maternal care for unspecified type scar from previous cesarean delivery: Secondary | ICD-10-CM

## 2018-02-07 DIAGNOSIS — Z3A38 38 weeks gestation of pregnancy: Secondary | ICD-10-CM | POA: Insufficient documentation

## 2018-02-07 DIAGNOSIS — O2613 Low weight gain in pregnancy, third trimester: Secondary | ICD-10-CM

## 2018-02-07 DIAGNOSIS — Z3A37 37 weeks gestation of pregnancy: Secondary | ICD-10-CM

## 2018-02-07 NOTE — MAU Note (Signed)
MFM called to see if patient could reschedule ultrasound. Was told she could come to MFM after discharge and wait to be worked in for her ultrasound.

## 2018-02-07 NOTE — MAU Note (Signed)
Pt was going to have an ultrasound today, but was having some pain and so the ultrasound was not done and she was send to MAU.

## 2018-02-13 ENCOUNTER — Ambulatory Visit: Payer: Self-pay

## 2018-02-13 ENCOUNTER — Ambulatory Visit (INDEPENDENT_AMBULATORY_CARE_PROVIDER_SITE_OTHER): Payer: Self-pay | Admitting: *Deleted

## 2018-02-13 ENCOUNTER — Ambulatory Visit (INDEPENDENT_AMBULATORY_CARE_PROVIDER_SITE_OTHER): Payer: Self-pay | Admitting: Obstetrics & Gynecology

## 2018-02-13 VITALS — BP 123/80 | HR 92 | Wt 123.6 lb

## 2018-02-13 DIAGNOSIS — O0990 Supervision of high risk pregnancy, unspecified, unspecified trimester: Secondary | ICD-10-CM

## 2018-02-13 DIAGNOSIS — O365930 Maternal care for other known or suspected poor fetal growth, third trimester, not applicable or unspecified: Secondary | ICD-10-CM

## 2018-02-13 NOTE — Progress Notes (Signed)
   PRENATAL VISIT NOTE  Subjective:  Sally Peterson is a 27 y.o. [phone] at 5558w1d being seen today for ongoing prenatal care.  She is currently monitored for the following issues for this low-risk pregnancy and has IUGR (intrauterine growth restriction) affecting care of mother; Supervision of high risk pregnancy, antepartum; Positive TB test; Previous preterm delivery, antepartum, third trimester; Previous cesarean section; and Poor weight gain of pregnancy on their problem list.  Patient reports no complaints.  Contractions: Irregular. Vag. Bleeding: None.  Movement: Present. Denies leaking of fluid.   The following portions of the patient's history were reviewed and updated as appropriate: allergies, current medications, past family history, past medical history, past social history, past surgical history and problem list. Problem list updated.  Objective:   Vitals:   02/13/18 1342  BP: 123/80  Pulse: 92  Weight: 123 lb 9.6 oz (56.1 kg)    Fetal Status: Fetal Heart Rate (bpm): 158   Movement: Present     General:  Alert, oriented and cooperative. Patient is in no acute distress.  Skin: Skin is warm and dry. No rash noted.   Cardiovascular: Normal heart rate noted  Respiratory: Normal respiratory effort, no problems with respiration noted  Abdomen: Soft, gravid, appropriate for gestational age.  Pain/Pressure: Present     Pelvic: Cervical exam deferred        Extremities: Normal range of motion.  Edema: None  Mental Status: Normal mood and affect. Normal behavior. Normal judgment and thought content.   Assessment and Plan:  Pregnancy: G3P1102 at 3358w1d  1. Supervision of high risk pregnancy, antepartum   2. Poor fetal growth affecting management of mother in third trimester, single or unspecified fetus 17%ile 02/07/18 US, no specific f/u recommended  Term labor symptoms and general obstetric precautions including but not limited to vaginal bleeding, contractions,  leaking of fluid and fetal movement were reviewed in detail with the patient. Please refer to After Visit Summary for other counseling recommendations.  Return in about 1 week (around 02/20/2018).  Future Appointments  Date Time Provider Department Center  02/13/2018  2:15 PM [facility]    Scheryl DarterJames Arnold, MD

## 2018-02-13 NOTE — Progress Notes (Signed)

## 2018-02-13 NOTE — Patient Instructions (Signed)
Vaginal Birth After Cesarean Delivery Vaginal birth after cesarean delivery (VBAC) is giving birth vaginally after previously delivering a baby by a cesarean. In the past, if a woman had a cesarean delivery, all births afterward would be done by cesarean delivery. This is no longer true. It can be safe for the mother to try a vaginal delivery after having a cesarean delivery. It is important to discuss VBAC with your health care provider early in the pregnancy so you can understand the risks, benefits, and options. It will give you time to decide what is best in your particular case. The final decision about whether to have a VBAC or repeat cesarean delivery should be between you and your health care provider. Any changes in your health or your baby's health during your pregnancy may make it necessary to change your initial decision about VBAC. Women who plan to have a VBAC should check with their health care provider to be sure that:  The previous cesarean delivery was done with a low transverse uterine cut (incision) (not a vertical classical incision).  The birth canal is big enough for the baby.  There were no other operations on the uterus.  An electronic fetal monitor (EFM) will be on at all times during labor.  An operating room will be available and ready in case an emergency cesarean delivery is needed.  A health care provider and surgical nursing staff will be available at all times during labor to be ready to do an emergency delivery cesarean if necessary.  An anesthesiologist will be present in case an emergency cesarean delivery is needed.  The nursery is prepared and has adequate personnel and necessary equipment available to care for the baby in case of an emergency cesarean delivery. Benefits of VBAC  Shorter stay in the hospital.  Avoidance of risks associated with cesarean delivery, such as: ? Surgical complications, such as opening of the incision or hernia in the  incision. ? Injury to other organs. ? Fever. This can occur if an infection develops after surgery. It can also occur as a reaction to the medicine given to make you numb during the surgery.  Less blood loss and need for blood transfusions.  Lower risk of blood clots and infection.  Shorter recovery.  Decreased risk for having to remove the uterus (hysterectomy).  Decreased risk for the placenta to completely or partially cover the opening of the uterus (placenta previa) with a future pregnancy.  Decrease risk in future labor and delivery. Risks of a VBAC  Tearing (rupture) of the uterus. This is occurs in less than 1% of VBACs. The risk of this happening is higher if: ? Steps are taken to begin the labor process (induce labor) or stimulate or strengthen contractions (augment labor). ? Medicine is used to soften (ripen) the cervix.  Having to remove the uterus (hysterectomy) if it ruptures. VBAC should not be done if:  The previous cesarean delivery was done with a vertical (classical) or T-shaped incision or you do not know what kind of incision was made.  You had a ruptured uterus.  You have had certain types of surgery on your uterus, such as removal of uterine fibroids. Ask your health care provider about other types of surgeries that prevent you from having a VBAC.  You have certain medical or childbirth (obstetrical) problems.  There are problems with the baby.  You have had two previous cesarean deliveries and no vaginal deliveries. Other facts to know about VBAC:  It   is safe to have an epidural anesthetic with VBAC.  It is safe to turn the baby from a breech position (attempt an external cephalic version).  It is safe to try a VBAC with twins.  VBAC may not be successful if your baby weights 8.8 lb (4 kg) or more. However, weight predictions are not always accurate and should not be used alone to decide if VBAC is right for you.  There is an increased failure rate  if the time between the cesarean delivery and VBAC is less than 19 months.  Your health care provider may advise against a VBAC if you have preeclampsia (high blood pressure, protein in the urine, and swelling of face and extremities).  VBAC is often successful if you previously gave birth vaginally.  VBAC is often successful when the labor starts spontaneously before the due date.  Delivering a baby through a VBAC is similar to having a normal spontaneous vaginal delivery. This information is not intended to replace advice given to you by your health care provider. Make sure you discuss any questions you have with your health care provider. Document Released: 12/30/2006 Document Revised: 12/15/2015 Document Reviewed: 02/05/2013 Elsevier Interactive Patient Education  2018 Elsevier Inc.  

## 2018-02-13 NOTE — Progress Notes (Signed)
   PRENATAL VISIT NOTE  Subjective:  Sally Peterson is a 27 y.o. [phone] at 5861w1d being seen today for ongoing prenatal care.  She is currently monitored for the following issues for this high-risk pregnancy and has IUGR (intrauterine growth restriction) affecting care of mother; Supervision of high risk pregnancy, antepartum; Positive TB test; Previous preterm delivery, antepartum, third trimester; Previous cesarean section; and Poor weight gain of pregnancy on their problem list.  Patient reports no complaints.  Contractions: Irregular. Vag. Bleeding: None.  Movement: Present. Denies leaking of fluid.   The following portions of the patient's history were reviewed and updated as appropriate: allergies, current medications, past family history, past medical history, past social history, past surgical history and problem list. Problem list updated.  Objective:   Vitals:   02/13/18 1342  BP: 123/80  Pulse: 92  Weight: 123 lb 9.6 oz (56.1 kg)    Fetal Status: Fetal Heart Rate (bpm): 158   Movement: Present     General:  Alert, oriented and cooperative. Patient is in no acute distress.  Skin: Skin is warm and dry. No rash noted.   Cardiovascular: Normal heart rate noted  Respiratory: Normal respiratory effort, no problems with respiration noted  Abdomen: Soft, gravid, appropriate for gestational age.  Pain/Pressure: Present     Pelvic: Cervical exam deferred        Extremities: Normal range of motion.  Edema: None  Mental Status: Normal mood and affect. Normal behavior. Normal judgment and thought content.   Assessment and Plan:  Pregnancy: G3P1102 at 2461w1d  1. Supervision of high risk pregnancy, antepartum   2. Poor fetal growth affecting management of mother in third trimester, single or unspecified fetus NST  Term labor symptoms and general obstetric precautions including but not limited to vaginal bleeding, contractions, leaking of fluid and fetal movement were  reviewed in detail with the patient. Please refer to After Visit Summary for other counseling recommendations.  Return in about 1 week (around 02/20/2018).  Future Appointments  Date Time Provider Department Center  02/13/2018  2:15 PM [facility]    Scheryl DarterJames Hans Rusher, MD

## 2018-02-19 ENCOUNTER — Other Ambulatory Visit: Payer: Self-pay

## 2018-02-19 ENCOUNTER — Encounter: Payer: Self-pay | Admitting: *Deleted

## 2018-02-19 ENCOUNTER — Telehealth: Payer: Self-pay | Admitting: *Deleted

## 2018-02-19 NOTE — Telephone Encounter (Addendum)
Called pt regarding today's appointment scheduled @ 1315. Her mobile number is not in service. I then called her husband Personal assistantCesar Rodriguez-Gutierrez w/interpreter Jamesetta OrleansEricka. She left a message stating that we are trying to reach his wife and to please call us back. If he or the pt call back, we need to see if she will come in today for appt. She would need to be here no later than 3:30pm.   8/2  1110  Pt kept appointment with Dr. Vergie LivingPickens on 8/1 and today for NST/BPP as scheduled.

## 2018-02-20 ENCOUNTER — Ambulatory Visit (INDEPENDENT_AMBULATORY_CARE_PROVIDER_SITE_OTHER): Payer: Self-pay | Admitting: Obstetrics and Gynecology

## 2018-02-20 VITALS — BP 125/74 | HR 76 | Wt 123.0 lb

## 2018-02-20 DIAGNOSIS — Z98891 History of uterine scar from previous surgery: Secondary | ICD-10-CM

## 2018-02-20 DIAGNOSIS — O365930 Maternal care for other known or suspected poor fetal growth, third trimester, not applicable or unspecified: Secondary | ICD-10-CM

## 2018-02-20 DIAGNOSIS — O0993 Supervision of high risk pregnancy, unspecified, third trimester: Secondary | ICD-10-CM

## 2018-02-20 DIAGNOSIS — Z758 Other problems related to medical facilities and other health care: Secondary | ICD-10-CM | POA: Insufficient documentation

## 2018-02-20 DIAGNOSIS — Z789 Other specified health status: Secondary | ICD-10-CM | POA: Insufficient documentation

## 2018-02-20 DIAGNOSIS — O365910 Maternal care for other known or suspected poor fetal growth, first trimester, not applicable or unspecified: Secondary | ICD-10-CM

## 2018-02-20 DIAGNOSIS — O0990 Supervision of high risk pregnancy, unspecified, unspecified trimester: Secondary | ICD-10-CM

## 2018-02-20 NOTE — Progress Notes (Signed)
Video Interpreter # [phone]

## 2018-02-21 ENCOUNTER — Ambulatory Visit (INDEPENDENT_AMBULATORY_CARE_PROVIDER_SITE_OTHER): Payer: Medicaid Other | Admitting: *Deleted

## 2018-02-21 ENCOUNTER — Ambulatory Visit: Payer: Self-pay

## 2018-02-21 VITALS — BP 116/86 | HR 76 | Wt 122.6 lb

## 2018-02-21 DIAGNOSIS — O365930 Maternal care for other known or suspected poor fetal growth, third trimester, not applicable or unspecified: Secondary | ICD-10-CM | POA: Diagnosis not present

## 2018-02-21 NOTE — Progress Notes (Signed)
Video interpreter Mikle BosworthCarlos [phone]#[id number] used for encounter.  IOL scheduled 8/7 @ 0800 (direct admit).  Pt was given instructions for IOL. She voiced understanding.   Pt informed that the ultrasound is considered a limited OB ultrasound and is not intended to be a complete ultrasound exam.  Patient also informed that the ultrasound is not being completed with the intent of assessing for fetal or placental anomalies or any pelvic abnormalities.  Explained that the purpose of today's ultrasound is to assess for presentation, BPP and amniotic fluid volume.  Patient acknowledges the purpose of the exam and the limitations of the study.

## 2018-02-21 NOTE — Progress Notes (Addendum)
Prenatal Visit Note Date: 02/20/2018 Clinic: [facility]  Subjective:  Sally Peterson is a 27 y.o. [id number] at 7034w1d being seen today for ongoing prenatal care.  She is currently monitored for the following issues for this high-risk pregnancy and has IUGR (intrauterine growth restriction) affecting care of mother; Supervision of high risk pregnancy, antepartum; Positive TB test; Previous preterm delivery, antepartum, third trimester; History of VBAC; Poor weight gain of pregnancy; and Language barrier on their problem list.  Patient reports no complaints.   Contractions: Irregular. Vag. Bleeding: None.  Movement: Present. Denies leaking of fluid.   The following portions of the patient's history were reviewed and updated as appropriate: allergies, current medications, past family history, past medical history, past social history, past surgical history and problem list. Problem list updated.  Objective:   Vitals:   02/20/18 1620 02/20/18 1622  BP: (!) 143/73 125/74  Pulse: 77 76  Weight: 123 lb (55.8 kg)     Fetal Status: Fetal Heart Rate (bpm): 132   Movement: Present     General:  Alert, oriented and cooperative. Patient is in no acute distress.  Skin: Skin is warm and dry. No rash noted.   Cardiovascular: Normal heart rate noted  Respiratory: Normal respiratory effort, no problems with respiration noted  Abdomen: Soft, gravid, appropriate for gestational age. Pain/Pressure: Present     Pelvic:  Cervical exam deferred        Extremities: Normal range of motion.  Edema: None  Mental Status: Normal mood and affect. Normal behavior. Normal judgment and thought content.   Urinalysis:      Assessment and Plan:  Pregnancy: G3P1102 at 2534w1d  1. Language barrier Interpreter used  2. History of VBAC Consent already signed  3. Supervision of high risk pregnancy, antepartum Nexplanon. First BP pt was late and rushing into clinic. No s/s of pre-eclampsia.    4. Poor fetal growth affecting management of mother in first trimester, single or unspecified fetus D[facility]w [facility]. If still 8/10, then repeat nst in 3 days. RN to set up IOL for 40wks.   Term labor symptoms and general obstetric precautions including but not limited to vaginal bleeding, contractions, leaking of fluid and fetal movement were reviewed in detail with the patient. Please refer to After Visit Summary for other counseling recommendations.  Return in about 1 day (around 02/21/2018) for 1d nst/bpp only. 4d nst only visit. 5wk pp visit.   [place] BingPickens, Erline Siddoway, MD

## 2018-02-24 ENCOUNTER — Encounter (HOSPITAL_COMMUNITY): Payer: Self-pay | Admitting: *Deleted

## 2018-02-24 ENCOUNTER — Other Ambulatory Visit: Payer: Self-pay

## 2018-02-24 ENCOUNTER — Inpatient Hospital Stay (HOSPITAL_COMMUNITY)
Admission: AD | Admit: 2018-02-24 | Discharge: 2018-02-26 | DRG: 805 | Disposition: A | Discharge status: Home / Self Care | Payer: Medicaid Other | Attending: Obstetrics and Gynecology | Admitting: Obstetrics and Gynecology

## 2018-02-24 DIAGNOSIS — O411230 Chorioamnionitis, third trimester, not applicable or unspecified: Secondary | ICD-10-CM | POA: Diagnosis present

## 2018-02-24 DIAGNOSIS — O365990 Maternal care for other known or suspected poor fetal growth, unspecified trimester, not applicable or unspecified: Secondary | ICD-10-CM | POA: Diagnosis present

## 2018-02-24 DIAGNOSIS — Z3A39 39 weeks gestation of pregnancy: Secondary | ICD-10-CM | POA: Diagnosis not present

## 2018-02-24 DIAGNOSIS — O34219 Maternal care for unspecified type scar from previous cesarean delivery: Secondary | ICD-10-CM | POA: Diagnosis present

## 2018-02-24 DIAGNOSIS — O09213 Supervision of pregnancy with history of pre-term labor, third trimester: Secondary | ICD-10-CM

## 2018-02-24 DIAGNOSIS — O411290 Chorioamnionitis, unspecified trimester, not applicable or unspecified: Secondary | ICD-10-CM

## 2018-02-24 DIAGNOSIS — R7611 Nonspecific reaction to tuberculin skin test without active tuberculosis: Secondary | ICD-10-CM | POA: Diagnosis present

## 2018-02-24 DIAGNOSIS — O365930 Maternal care for other known or suspected poor fetal growth, third trimester, not applicable or unspecified: Secondary | ICD-10-CM | POA: Diagnosis present

## 2018-02-24 DIAGNOSIS — Z98891 History of uterine scar from previous surgery: Secondary | ICD-10-CM

## 2018-02-24 DIAGNOSIS — Z3483 Encounter for supervision of other normal pregnancy, third trimester: Secondary | ICD-10-CM | POA: Diagnosis present

## 2018-02-24 DIAGNOSIS — O0990 Supervision of high risk pregnancy, unspecified, unspecified trimester: Secondary | ICD-10-CM

## 2018-02-24 DIAGNOSIS — O34211 Maternal care for low transverse scar from previous cesarean delivery: Secondary | ICD-10-CM

## 2018-02-24 LAB — CBC
HCT: 34.9 % — ABNORMAL LOW (ref 36.0–46.0)
Hemoglobin: 11.8 g/dL — ABNORMAL LOW (ref 12.0–15.0)
MCH: 29.8 pg (ref 26.0–34.0)
MCHC: 33.8 g/dL (ref 30.0–36.0)
MCV: 88.1 fL (ref 78.0–100.0)
Platelets: 312 10*3/uL (ref 150–400)
RBC: 3.96 MIL/uL (ref 3.87–5.11)
RDW: 13.9 % (ref 11.5–15.5)
WBC: 11 10*3/uL — ABNORMAL HIGH (ref 4.0–10.5)

## 2018-02-24 LAB — ABO/RH: ABO/RH(D): O POS

## 2018-02-24 LAB — TYPE AND SCREEN
ABO/RH(D): O POS
ANTIBODY SCREEN: NEGATIVE

## 2018-02-24 MED ORDER — FLEET ENEMA 7-19 GM/118ML RE ENEM: 1.0000 | ENEMA | RECTAL | Status: DC | PRN

## 2018-02-24 MED ORDER — OXYTOCIN BOLUS FROM INFUSION
500.0000 mL | Freq: Once | INTRAVENOUS | Status: AC
  Administered 2018-02-24: 500 mL via INTRAVENOUS

## 2018-02-24 MED ORDER — OXYTOCIN 40 UNITS IN LACTATED RINGERS INFUSION - SIMPLE MED: 2.5000 [IU]/h | INTRAVENOUS | Status: DC

## 2018-02-24 MED ORDER — OXYCODONE-ACETAMINOPHEN 5-325 MG PO TABS: 2.0000 | ORAL_TABLET | ORAL | Status: DC | PRN

## 2018-02-24 MED ORDER — OXYCODONE-ACETAMINOPHEN 5-325 MG PO TABS: 1.0000 | ORAL_TABLET | ORAL | Status: DC | PRN

## 2018-02-24 MED ORDER — LIDOCAINE HCL (PF) 1 % IJ SOLN
30.0000 mL | INTRAMUSCULAR | Status: DC | PRN
  Filled 2018-02-24: qty 30

## 2018-02-24 MED ORDER — IBUPROFEN 600 MG PO TABS
600.0000 mg | ORAL_TABLET | Freq: Four times a day (QID) | ORAL | Status: DC
  Administered 2018-02-24 – 2018-02-26 (×7): 600 mg via ORAL
  Filled 2018-02-24 (×7): qty 1

## 2018-02-24 MED ORDER — LACTATED RINGERS IV SOLN: 500.0000 mL | INTRAVENOUS | Status: DC | PRN

## 2018-02-24 MED ORDER — ACETAMINOPHEN 325 MG PO TABS: 650.0000 mg | ORAL_TABLET | ORAL | Status: DC | PRN

## 2018-02-24 MED ORDER — ONDANSETRON HCL 4 MG/2ML IJ SOLN: 4.0000 mg | Freq: Four times a day (QID) | INTRAMUSCULAR | Status: DC | PRN

## 2018-02-24 MED ORDER — LACTATED RINGERS IV SOLN
INTRAVENOUS | Status: DC
  Administered 2018-02-24: 22:00:00 via INTRAVENOUS

## 2018-02-24 MED ORDER — LIDOCAINE HCL (PF) 1 % IJ SOLN: 30.0000 mL | INTRAMUSCULAR | Status: DC | PRN

## 2018-02-24 MED ORDER — LACTATED RINGERS IV SOLN
INTRAVENOUS | Status: DC
  Administered 2018-02-24: 21:00:00 via INTRAVENOUS

## 2018-02-24 MED ORDER — SOD CITRATE-CITRIC ACID 500-334 MG/5ML PO SOLN: 30.0000 mL | ORAL | Status: DC | PRN

## 2018-02-24 MED ORDER — OXYTOCIN 40 UNITS IN LACTATED RINGERS INFUSION - SIMPLE MED
2.5000 [IU]/h | INTRAVENOUS | Status: DC
  Filled 2018-02-24: qty 1000

## 2018-02-24 MED ORDER — OXYTOCIN BOLUS FROM INFUSION: 500.0000 mL | Freq: Once | INTRAVENOUS | Status: DC

## 2018-02-24 MED ORDER — ACETAMINOPHEN 325 MG PO TABS
650.0000 mg | ORAL_TABLET | ORAL | Status: DC | PRN
  Filled 2018-02-24: qty 2

## 2018-02-24 MED ORDER — ACETAMINOPHEN 325 MG PO TABS
650.0000 mg | ORAL_TABLET | ORAL | Status: DC | PRN
Start: 2018-02-24 — End: 2018-02-26
  Administered 2018-02-24: 650 mg via ORAL

## 2018-02-24 MED ORDER — LACTATED RINGERS IV SOLN
500.0000 mL | INTRAVENOUS | Status: DC | PRN
  Administered 2018-02-24: 500 mL via INTRAVENOUS

## 2018-02-24 NOTE — H&P (Signed)
Sally Peterson is a 27 y.o. female [phone] @[redacted]w[redacted]d  presenting for active labor at term.     OB History    Gravida  3   Para  2   Term  1   Preterm  1   AB      Living  2     SAB      TAB      Ectopic      Multiple      Live Births  2          Past Medical History:  Diagnosis Date  . Medical history non-contributory    Past Surgical History:  Procedure Laterality Date  . CESAREAN SECTION     Family History: family history is not on file. Social History:  reports that she has never smoked. She has never used smokeless tobacco. She reports that she does not drink alcohol or use drugs.     Maternal Diabetes: No Genetic Screening: Declined Maternal Ultrasounds/Referrals: Abnormal:  Findings:   IUGR Fetal Ultrasounds or other Referrals:  None Maternal Substance Abuse:  No Significant Maternal Medications:  None Significant Maternal Lab Results:  Lab values include: Group B Strep negative Other Comments:  None  Review of Systems  Constitutional: Negative for chills, fever and malaise/fatigue.  Eyes: Negative for blurred vision.  Respiratory: Negative for cough and shortness of breath.   Cardiovascular: Negative for chest pain.  Gastrointestinal: Positive for abdominal pain. Negative for heartburn and vomiting.  Genitourinary: Negative for dysuria, frequency and urgency.  Musculoskeletal: Negative.   Neurological: Negative for dizziness and headaches.  Psychiatric/Behavioral: Negative for depression.   Maternal Medical History:  Reason for admission: Contractions.   Contractions: Onset was 3-5 hours ago.   Frequency: regular.   Perceived severity is moderate.    Fetal activity: Perceived fetal activity is normal.   Last perceived fetal movement was within the past hour.    Prenatal complications: IUGR.   Prenatal Complications - Diabetes: none.    Dilation: 5 Effacement (%): 90 Station: -2 Exam by:: [employer] AssociationMadison Lomax, RN Blood pressure  115/78, pulse 90, temperature 98.2 F (36.8 C), temperature source Oral, resp. rate 15, height 5' 1.5" (1.562 m), weight 124 lb (56.2 kg), last menstrual period 05/06/2017, SpO2 99 %. Maternal Exam:  Uterine Assessment: Contraction strength is moderate.  Contraction frequency is regular.   Abdomen: Surgical scars: low transverse.   Estimated fetal weight is 2490 g, 17%tile at 37 weeks.   Fetal presentation: vertex  Cervix: Cervix evaluated by digital exam.     Physical Exam  Nursing note and vitals reviewed. Constitutional: She is oriented to person, place, and time. She appears well-developed and well-nourished.  Neck: Normal range of motion.  Cardiovascular: Normal rate, regular rhythm and normal heart sounds.  Respiratory: Effort normal and breath sounds normal.  GI: Soft.  Musculoskeletal: Normal range of motion.  Neurological: She is alert and oriented to person, place, and time.  Skin: Skin is warm and dry.  Psychiatric: She has a normal mood and affect. Her behavior is normal. Judgment and thought content normal.    Prenatal labs: ABO, Rh: O/Positive/-- (04/22 0000) Antibody: Negative (04/22 0000) Rubella: Immune (04/22 0000) RPR: Nonreactive (04/22 0000)  HBsAg: Negative (04/22 0000)  HIV: Non-reactive (04/22 0000)  GBS: Negative (07/11 0000)   Assessment/Plan: [id number]$G3P1102@[redacted]w[redacted]d  admitted for active labor GBS negative TOLAC  Anticipate VBAC Expectant management   Sharen CounterLisa Leftwich-Kirby 02/24/2018, 9:35 PM

## 2018-02-24 NOTE — MAU Note (Signed)
Pt reports contractions, denies leaking fluid,or bleeding.

## 2018-02-24 NOTE — Progress Notes (Signed)
Upon entering the room, baby was not being held skin to skin.  Sister was holding baby.  Mom does not want to do skin to skin because of discomfort and sister does not want to do it.  Misty StanleyLisa, CNM made aware.  Will continue to monitor baby.

## 2018-02-25 ENCOUNTER — Encounter (HOSPITAL_COMMUNITY): Payer: Self-pay

## 2018-02-25 DIAGNOSIS — O365990 Maternal care for other known or suspected poor fetal growth, unspecified trimester, not applicable or unspecified: Secondary | ICD-10-CM

## 2018-02-25 LAB — RPR: RPR Ser Ql: NONREACTIVE

## 2018-02-25 MED ORDER — COCONUT OIL OIL: 1.0000 "application " | TOPICAL_OIL | Status: DC | PRN

## 2018-02-25 MED ORDER — TETANUS-DIPHTH-ACELL PERTUSSIS 5-2.5-18.5 LF-MCG/0.5 IM SUSP: 0.5000 mL | Freq: Once | INTRAMUSCULAR | Status: DC

## 2018-02-25 MED ORDER — ONDANSETRON HCL 4 MG/2ML IJ SOLN: 4.0000 mg | INTRAMUSCULAR | Status: DC | PRN

## 2018-02-25 MED ORDER — SIMETHICONE 80 MG PO CHEW: 80.0000 mg | CHEWABLE_TABLET | ORAL | Status: DC | PRN

## 2018-02-25 MED ORDER — DIBUCAINE 1 % RE OINT: 1.0000 "application " | TOPICAL_OINTMENT | RECTAL | Status: DC | PRN

## 2018-02-25 MED ORDER — BENZOCAINE-MENTHOL 20-0.5 % EX AERO: 1.0000 "application " | INHALATION_SPRAY | CUTANEOUS | Status: DC | PRN

## 2018-02-25 MED ORDER — PRENATAL MULTIVITAMIN CH
1.0000 | ORAL_TABLET | Freq: Every day | ORAL | Status: DC
  Administered 2018-02-25 – 2018-02-26 (×2): 1 via ORAL
  Filled 2018-02-25 (×2): qty 1

## 2018-02-25 MED ORDER — SENNOSIDES-DOCUSATE SODIUM 8.6-50 MG PO TABS
2.0000 | ORAL_TABLET | ORAL | Status: DC
  Administered 2018-02-25: 2 via ORAL
  Filled 2018-02-25: qty 2

## 2018-02-25 MED ORDER — ONDANSETRON HCL 4 MG PO TABS: 4.0000 mg | ORAL_TABLET | ORAL | Status: DC | PRN

## 2018-02-25 MED ORDER — WITCH HAZEL-GLYCERIN EX PADS: 1.0000 "application " | MEDICATED_PAD | CUTANEOUS | Status: DC | PRN

## 2018-02-25 MED ORDER — DIPHENHYDRAMINE HCL 25 MG PO CAPS: 25.0000 mg | ORAL_CAPSULE | Freq: Four times a day (QID) | ORAL | Status: DC | PRN

## 2018-02-25 MED ORDER — OXYCODONE HCL 5 MG PO TABS: 10.0000 mg | ORAL_TABLET | ORAL | Status: DC | PRN

## 2018-02-25 MED ORDER — ZOLPIDEM TARTRATE 5 MG PO TABS: 5.0000 mg | ORAL_TABLET | Freq: Every evening | ORAL | Status: DC | PRN

## 2018-02-25 MED ORDER — OXYCODONE HCL 5 MG PO TABS: 5.0000 mg | ORAL_TABLET | ORAL | Status: DC | PRN

## 2018-02-25 NOTE — Progress Notes (Addendum)
Post Partum Day 1  Subjective: no complaints, up ad lib, voiding and tolerating PO  Objective: Blood pressure 110/70, pulse 72, temperature 98.2 F (36.8 C), temperature source Oral, resp. rate 18, height 5' 1.5" (1.562 m), weight 56.2 kg (124 lb), last menstrual period 05/06/2017, SpO2 99 %, unknown if currently breastfeeding.  Physical Exam:  General: alert, cooperative and appears stated age Lochia: appropriate Uterine Fundus: firm DVT Evaluation: No evidence of DVT seen on physical exam.  Recent Labs    02/24/18 2116  HGB 11.8*  HCT 34.9*    Assessment/Plan: Plan for discharge tomorrow, Breastfeeding and Contraception Nexplanon outpatient   LOS: 1 day   Sally Peterson 02/25/2018, 7:44 AM   Midwife attestation Post Partum Day 1 I have seen and examined this patient and agree with above documentation in the resident's note.   Sally GlennMonica Griselda MinerLopez Peterson is a 27 y.o. [phone] s/p VBAC.  Pt denies problems with ambulating, voiding or po intake. Pain is well controlled.  Plan for birth control is no method.  Method of Feeding: breast  PE:  BP 110/70 (BP Location: Left Arm)   Pulse 72   Temp 98.2 F (36.8 C) (Oral)   Resp 18   Ht 5' 1.5" (1.562 m)   Wt 56.2 kg (124 lb)   LMP 05/06/2017   SpO2 99%   Breastfeeding? Unknown   BMI 23.05 kg/m  Gen: well appearing Heart: reg rate Lungs: normal WOB Fundus firm Ext: soft, no pain, no edema  Plan for discharge:tomorrow  Sharen CounterLisa Leftwich-Kirby, CNM 7:49 AM

## 2018-02-25 NOTE — Lactation Note (Signed)
This note was copied from a baby's chart. Lactation Consultation Note  Patient Name: Sally Peterson WJXBJ'YToday's Date: 02/25/2018 Reason for consult: Initial assessment;Term   Initial consult with Exp BF mom of 25 hour old infant. Spoke with mother with assistance of [employer] phone Spanish interpreter, Chil[place]anda  # [phone]. Infant with 7 BF for 15-55 minutes and 2 stools since birth. LATCH scores 7. Infant weight 6 pounds 9.1 ounces with 1% weight loss since birth. Infant currently asleep in crib. Infant with rolled up blanket under her head, advised family to remove from crib.   Enc mom to feed infant STS 8-12 x in 24 hours at first feeding cues. Reviewed supply and demand and milk coming to volume. Mom asking for formula as she feels she has no milk. Reviewed LEAD,  supply and demand, colostrum, volumes infants typically need at 25 this time and milk coming to volume. Enc mom to hand express to encourage milk to come in. Mom reports she knows how to hand express. Enc mom to try to just BF for the first few weeks unless medically indicated to promote the best supply she can.    Discussed if mom does decide to give formula to BF infant on both breasts prior to offering formula. Reviewed mom will need to call out to ask for formula for infant and to give small volumes so infant will still BF. Mom asked what kind of formula she should use, [facility] so recommended [employer]. Mom would like to talk with [facility], WIC Reps. did go into room after LC left. There is formula mixed in the room for mom's 1 yo, mom reports she did not give infant any formula yet. Reviewed that infant is sleepy peacefully after BF.   BF Resources handout and LC Brochure given, mom informed of IP/OP Services, BF Support Groups and LC phone #. Mom reports she has no other questions/cocnerns at this time. Mom to call out for BF Assistance as needed.   As LC was getting ready to leave mom asked about scheduling a Ped  appt. She says she did not understand what the Ped said earlier about scheduling an appt. [place] 25 [place] Enc mom to call that Ped and ask them if they will see this infant. Enc mom to make an appointment of Thursday or Friday of this week, mom voiced understanding.   Report to [place], Charity fundraiserN.    Maternal Data Formula Feeding for Exclusion: Yes Reason for exclusion: Mother's choice to formula and breast feed on admission Has patient been taught Hand Expression?: Yes Does the patient have breastfeeding experience prior to this delivery?: Yes  Feeding    LATCH Score                   Interventions Interventions: Breast feeding basics reviewed  Lactation Tools Discussed/Used WIC Program: Yes   Consult Status Consult Status: Follow-up Date: 02/26/18 Follow-up type: In-patient    Silas FloodSharon S Linzy Darling 02/25/2018, 2:51 PM

## 2018-02-26 MED ORDER — COCONUT OIL OIL: 1.0000 "application " | TOPICAL_OIL | 0 refills | Status: AC | PRN

## 2018-02-26 MED ORDER — IBUPROFEN 600 MG PO TABS: 600.0000 mg | ORAL_TABLET | Freq: Four times a day (QID) | ORAL | 0 refills | Status: DC

## 2018-02-26 NOTE — Discharge Summary (Signed)
OB Discharge Summary     Patient Name: Sally Peterson DOB: 10/21/1990 MRN: [id number]  Date of admission: 02/24/2018 Delivering MD: Sharen Counter A   Date of discharge: 02/26/2018  Admitting diagnosis: 39.4WKS CTX Intrauterine pregnancy: [redacted]w[redacted]d     Secondary diagnosis:  Active Problems:   Indication for care in labor or delivery   Normal labor   VBAC, delivered, current hospitalization   Antepartum chorioamnionitis  Additional problems: non     Discharge diagnosis: Term Pregnancy Delivered                                                                                                Post partum procedures:None  Augmentation: none  Complications: None  Hospital course:  Onset of Labor With Vaginal Delivery     27 y.o. yo [id number] at [redacted]w[redacted]d was admitted in Active Labor on 02/24/2018. Patient had an uncomplicated labor course as follows:  Membrane Rupture Time/Date: 10:15 PM ,02/24/2018   Intrapartum Procedures: Episiotomy: None [1]                                         Lacerations:  None [1]  Patient had a delivery of a Viable infant. 02/24/2018  Information for the patient's newborn:  Tahirih Lair, Girl Livia [[id number]]  Delivery Method: Vaginal, Spontaneous(Filed from Delivery Summary)    Pateint had an uncomplicated postpartum course.  She is ambulating, tolerating a regular diet, passing flatus, and urinating well. Patient is discharged home in stable condition on 02/26/18.   Physical exam  Vitals:   02/25/18 0545 02/25/18 0900 02/25/18 1416 02/25/18 2316  BP: 110/70 107/60 118/82 112/78  Pulse: 72 79 82 89  Resp: 18 16 16 16   Temp: 98.2 F (36.8 C) 98.2 F (36.8 C) 98.1 F (36.7 C) 97.9 F (36.6 C)  TempSrc: Oral Oral Oral Oral  SpO2:      Weight:      Height:       General: alert, cooperative and no distress Lochia: appropriate Uterine Fundus: firm Incision: N/A DVT Evaluation: No evidence of DVT seen on physical exam. Labs: Lab Results   Component Value Date   WBC 11.0 (H) 02/24/2018   HGB 11.8 (L) 02/24/2018   HCT 34.9 (L) 02/24/2018   MCV 88.1 02/24/2018   PLT 312 02/24/2018   CMP Latest Ref Rng & Units 09/01/2017  Glucose 65 - 99 mg/dL 90  BUN 6 - 20 mg/dL <9(G)  Creatinine 2.95 - 1.00 mg/dL 2.84  Sodium 132 - 440 mmol/L 136  Potassium 3.5 - 5.1 mmol/L 3.1(L)  Chloride 101 - 111 mmol/L 104  CO2 22 - 32 mmol/L 20(L)  Calcium 8.9 - 10.3 mg/dL 1.0(U)  Total Protein 6.5 - 8.1 g/dL 7.1  Total Bilirubin 0.3 - 1.2 mg/dL 0.4  Alkaline Phos 38 - 126 U/L 60  AST 15 - 41 U/L 21  ALT 14 - 54 U/L 14    Discharge instruction: per After Visit Summary and "Baby and Me Booklet".  After visit meds:  No current facility-administered medications on file prior to encounter.    Current Outpatient Medications on File Prior to Encounter  Medication Sig Dispense Refill  . Prenatal Vit-Fe Fumarate-FA (PRENATAL COMPLETE) 14-0.4 MG TABS Take 1 capsule by mouth daily. 60 each 0    Diet: routine diet  Activity: Advance as tolerated. Pelvic rest for 6 weeks.   Outpatient follow up:6 weeks Follow up Appt: Future Appointments  Date Time Provider Department Center  04/07/2018 10:35 AM Armando ReichertHogan, Heather D, CNM [facility]   Follow up Visit:No follow-ups on file.  Postpartum contraception: Nexplanon  Newborn Data: Live born female  Birth Weight: 6 lb 9.8 oz (2999 g) APGAR: 9, 9  Newborn Delivery   Birth date/time:  02/24/2018 22:38:00 Delivery type:  Vaginal, Spontaneous     Baby Feeding: Breast Disposition:home with mother   02/26/2018 Federico FlakeKimberly Niles Newton, MD

## 2018-02-26 NOTE — Discharge Instructions (Signed)
Postpartum Care After Vaginal Delivery °The period of time right after you deliver your newborn is called the postpartum period. °What kind of medical care will I receive? °· You may continue to receive fluids and medicines through an IV tube inserted into one of your veins. °· If an incision was made near your vagina (episiotomy) or if you had some vaginal tearing during delivery, cold compresses may be placed on your episiotomy or your tear. This helps to reduce pain and swelling. °· You may be given a squirt bottle to use when you go to the bathroom. You may use this until you are comfortable wiping as usual. To use the squirt bottle, follow these steps: °? Before you urinate, fill the squirt bottle with warm water. Do not use hot water. °? After you urinate, while you are sitting on the toilet, use the squirt bottle to rinse the area around your urethra and vaginal opening. This rinses away any urine and blood. °? You may do this instead of wiping. As you start healing, you may use the squirt bottle before wiping yourself. Make sure to wipe gently. °? Fill the squirt bottle with clean water every time you use the bathroom. °· You will be given sanitary pads to wear. °How can I expect to feel? °· You may not feel the need to urinate for several hours after delivery. °· You will have some soreness and pain in your abdomen and vagina. °· If you are breastfeeding, you may have uterine contractions every time you breastfeed for up to several weeks postpartum. Uterine contractions help your uterus return to its normal size. °· It is normal to have vaginal bleeding (lochia) after delivery. The amount and appearance of lochia is often similar to a menstrual period in the first week after delivery. It will gradually decrease over the next few weeks to a dry, yellow-brown discharge. For most women, lochia stops completely by 6-8 weeks after delivery. Vaginal bleeding can vary from woman to woman. °· Within the first few  days after delivery, you may have breast engorgement. This is when your breasts feel heavy, full, and uncomfortable. Your breasts may also throb and feel hard, tightly stretched, warm, and tender. After this occurs, you may have milk leaking from your breasts. Your health care provider can help you relieve discomfort due to breast engorgement. Breast engorgement should go away within a few days. °· You may feel more sad or worried than normal due to hormonal changes after delivery. These feelings should not last more than a few days. If these feelings do not go away after several days, speak with your health care provider. °How should I care for myself? °· Tell your health care provider if you have pain or discomfort. °· Drink enough water to keep your urine clear or pale yellow. °· Wash your hands thoroughly with soap and water for at least 20 seconds after changing your sanitary pads, after using the toilet, and before holding or feeding your baby. °· If you are not breastfeeding, avoid touching your breasts a lot. Doing this can make your breasts produce more milk. °· If you become weak or lightheaded, or you feel like you might faint, ask for help before: °? Getting out of bed. °? Showering. °· Change your sanitary pads frequently. Watch for any changes in your flow, such as a sudden increase in volume, a change in color, the passing of large blood clots. If you pass a blood clot from your vagina, save it   to show to your health care provider. Do not flush blood clots down the toilet without having your health care provider look at them. °· Make sure that all your vaccinations are up to date. This can help protect you and your baby from getting certain diseases. You may need to have immunizations done before you leave the hospital. °· If desired, talk with your health care provider about methods of family planning or birth control (contraception). °How can I start bonding with my baby? °Spending as much time as  possible with your baby is very important. During this time, you and your baby can get to know each other and develop a bond. Having your baby stay with you in your room (rooming in) can give you time to get to know your baby. Rooming in can also help you become comfortable caring for your baby. Breastfeeding can also help you bond with your baby. °How can I plan for returning home with my baby? °· Make sure that you have a car seat installed in your vehicle. °? Your car seat should be checked by a certified car seat installer to make sure that it is installed safely. °? Make sure that your baby fits into the car seat safely. °· Ask your health care provider any questions you have about caring for yourself or your baby. Make sure that you are able to contact your health care provider with any questions after leaving the hospital. °This information is not intended to replace advice given to you by your health care provider. Make sure you discuss any questions you have with your health care provider. °Document Released: 05/06/2007 Document Revised: 12/12/2015 Document Reviewed: 06/13/2015 °Elsevier Interactive Patient Education © 2018 Elsevier Inc. ° °

## 2018-02-26 NOTE — Discharge Summary (Signed)
OB Discharge Summary     Patient Name: Sally Peterson DOB: 12/28/1990 MRN: [id number]  Date of admission: 02/24/2018 Delivering MD: Sharen Counter A   Date of discharge: 02/26/2018  Admitting diagnosis: 39.4WKS CTX Intrauterine pregnancy: [redacted]w[redacted]d     Secondary diagnosis:  Active Problems:   Indication for care in labor or delivery   Normal labor   VBAC, delivered, current hospitalization   Antepartum chorioamnionitis  Additional problems: n/a     Discharge diagnosis: VBAC                                                                                                Post partum procedures:none  Augmentation: none  Complications: VBAC, triple I immediately prior to delivery (temp of 101.0) with no time for meds  Hospital course:  Onset of Labor With Vaginal Delivery     27 y.o. yo [id number] at [redacted]w[redacted]d was admitted in Active Labor on 02/24/2018. Patient had a labor course complicated by triple I immediately prior to delivery as follows:  Membrane Rupture Time/Date: 10:15 PM ,02/24/2018   Intrapartum Procedures: Episiotomy: None [1]                                         Lacerations:  None [1]  Patient had a delivery of a Viable infant. 02/24/2018  Information for the patient's newborn:  Lawson Isabell, Girl Yareth [[id number]]  Delivery Method: Vaginal, Spontaneous(Filed from Delivery Summary)    Patient had an uncomplicated postpartum course.  She is ambulating, tolerating a regular diet, passing flatus, and urinating well. Patient is discharged home in stable condition on 02/26/18.   Physical exam  Vitals:   02/25/18 0545 02/25/18 0900 02/25/18 1416 02/25/18 2316  BP: 110/70 107/60 118/82 112/78  Pulse: 72 79 82 89  Resp: 18 16 16 16   Temp: 98.2 F (36.8 C) 98.2 F (36.8 C) 98.1 F (36.7 C) 97.9 F (36.6 C)  TempSrc: Oral Oral Oral Oral  SpO2:      Weight:      Height:       General: alert, cooperative and no distress Uterine Fundus: firm DVT Evaluation:  No significant calf/ankle edema. Labs: Lab Results  Component Value Date   WBC 11.0 (H) 02/24/2018   HGB 11.8 (L) 02/24/2018   HCT 34.9 (L) 02/24/2018   MCV 88.1 02/24/2018   PLT 312 02/24/2018   CMP Latest Ref Rng & Units 09/01/2017  Glucose 65 - 99 mg/dL 90  BUN 6 - 20 mg/dL <6(N)  Creatinine 6.29 - 1.00 mg/dL 5.28  Sodium 413 - 244 mmol/L 136  Potassium 3.5 - 5.1 mmol/L 3.1(L)  Chloride 101 - 111 mmol/L 104  CO2 22 - 32 mmol/L 20(L)  Calcium 8.9 - 10.3 mg/dL 0.1(U)  Total Protein 6.5 - 8.1 g/dL 7.1  Total Bilirubin 0.3 - 1.2 mg/dL 0.4  Alkaline Phos 38 - 126 U/L 60  AST 15 - 41 U/L 21  ALT 14 - 54 U/L 14    Discharge  instruction: per After Visit Summary and "Baby and Me Booklet".  After visit meds:    Diet: home with mother  Activity: Advance as tolerated. Pelvic rest for 6 weeks.   Outpatient follow up:  Follow up Appt: Future Appointments  Date Time Provider Department Center  04/07/2018 10:35 AM Armando ReichertHogan, Heather D, CNM [facility]   Follow up Visit:No follow-ups on file.  Postpartum contraception: Nexplanon  Newborn Data: Live born female  Birth Weight: 6 lb 9.8 oz (2999 g) APGAR: 9, 9  Newborn Delivery   Birth date/time:  02/24/2018 22:38:00 Delivery type:  Vaginal, Spontaneous     Baby Feeding: Bottle and Breast Disposition:home with mother   02/26/2018 Natasha MeadBryan A Chadwick, Medical Student

## 2018-02-26 NOTE — Lactation Note (Signed)
This note was copied from a baby's chart. Lactation Consultation Note  Patient Name: [name] ZOXWR'UToday's Date: 02/26/2018 Reason for consult: Follow-up assessment  Spanish interpreter used [phone] P3, Baby 35 hours old.  Mother is breastfeeding and giving formula. Discussed supply and demand.  Mom encouraged to feed baby 8-12 times/24 hours and with feeding cues.  Reviewed engorgement care and monitoring voids/stools. Provided mother w/ manual pump w/ instructions.   Maternal Data    Feeding    LATCH Score                   Interventions Interventions: Breast feeding basics reviewed  Lactation Tools Discussed/Used     Consult Status Consult Status: Follow-up Date: 02/27/18 Follow-up type: In-patient    Dahlia ByesBerkelhammer, Herve Haug [facility] 02/26/2018, 9:54 AM

## 2018-03-12 NOTE — Discharge Summary (Signed)
OB Discharge Summary     Patient Name: Sally RoseMonica Lopez Peterson DOB: Jun 06, 1991 MRN: [id number]  Date of admission: 02/24/2018 Delivering MD: Sharen CounterLEFTWICH-KIRBY, LISA A   Date of discharge: 03/12/2018  Admitting diagnosis: 39.4WKS CTX Intrauterine pregnancy: 6451w5d     Secondary diagnosis:  Active Problems:   Indication for care in labor or delivery   Normal labor   VBAC, delivered, current hospitalization   Antepartum chorioamnionitis  Additional problems: none     Discharge diagnosis: Term Pregnancy Delivered                                                                                                Post partum procedures:none  Augmentation: none  Complications: None  Hospital course:  Onset of Labor With Vaginal Delivery     27 y.o. yo [id number] at 6451w5d was admitted in Active Labor on 02/24/2018. Patient had an uncomplicated labor course as follows:  Membrane Rupture Time/Date: 10:15 PM ,02/24/2018   Intrapartum Procedures: Episiotomy: None [1]                                         Lacerations:  None [1]  Patient had a delivery of a Viable infant. 02/24/2018  Information for the patient's newborn:  Rodney BoozeRodriguez Lopez, Merly [[id number]][[id number]]  Delivery Method: Vaginal, Spontaneous(Filed from Delivery Summary)    Pateint had an uncomplicated postpartum course.  She is ambulating, tolerating a regular diet, passing flatus, and urinating well. Patient is discharged home in stable condition on 03/12/18.   Physical exam  Vitals:   02/25/18 0545 02/25/18 0900 02/25/18 1416 02/25/18 2316  BP: 110/70 107/60 118/82 112/78  Pulse: 72 79 82 89  Resp: 18 16 16 16   Temp: 98.2 F (36.8 C) 98.2 F (36.8 C) 98.1 F (36.7 C) 97.9 F (36.6 C)  TempSrc: Oral Oral Oral Oral  SpO2:      Weight:      Height:       General: alert, cooperative and no distress Lochia: appropriate Uterine Fundus: firm Incision: N/A DVT Evaluation: No evidence of DVT seen on physical exam. Labs: Lab Results   Component Value Date   WBC 11.0 (H) 02/24/2018   HGB 11.8 (L) 02/24/2018   HCT 34.9 (L) 02/24/2018   MCV 88.1 02/24/2018   PLT 312 02/24/2018   CMP Latest Ref Rng & Units 09/01/2017  Glucose 65 - 99 mg/dL 90  BUN 6 - 20 mg/dL <0(N<5(L)  Creatinine 0.270.44 - 1.00 mg/dL 2.530.46  Sodium 664135 - 403145 mmol/L 136  Potassium 3.5 - 5.1 mmol/L 3.1(L)  Chloride 101 - 111 mmol/L 104  CO2 22 - 32 mmol/L 20(L)  Calcium 8.9 - 10.3 mg/dL 4.7(Q8.8(L)  Total Protein 6.5 - 8.1 g/dL 7.1  Total Bilirubin 0.3 - 1.2 mg/dL 0.4  Alkaline Phos 38 - 126 U/L 60  AST 15 - 41 U/L 21  ALT 14 - 54 U/L 14    Discharge instruction: per After Visit Summary and "Baby and Me Booklet".  After  visit meds:  Allergies as of 02/26/2018      Reactions   Penicillins Hives, Shortness Of Breath   Has patient had a PCN reaction causing immediate rash, facial/tongue/throat swelling, SOB or lightheadedness with hypotension: Unknown Has patient had a PCN reaction causing severe rash involving mucus membranes or skin necrosis: Unknown Has patient had a PCN reaction that required hospitalization: Unknown Has patient had a PCN reaction occurring within the last 10 years: Unknown If all of the above answers are "NO", then may proceed with Cephalosporin use.      Medication List    TAKE these medications   coconut oil Oil Apply 1 application topically as needed.   ibuprofen 600 MG tablet Commonly known as:  ADVIL,MOTRIN Take 1 tablet (600 mg total) by mouth every 6 (six) hours.   PRENATAL COMPLETE 14-0.4 MG Tabs Take 1 capsule by mouth daily.       Diet: routine diet  Activity: Advance as tolerated. Pelvic rest for 6 weeks.   Outpatient follow up:6 weeks Follow up Appt: Future Appointments  Date Time Provider Department Center  04/07/2018 10:35 AM Armando ReichertHogan, Heather D, CNM [facility]   Follow up Visit:No follow-ups on file.  Postpartum contraception: Nexplanon  Newborn Data: Live born female  Birth Weight: 6 lb 9.8 oz (2999  g) APGAR: 9, 9  Newborn Delivery   Birth date/time:  02/24/2018 22:38:00 Delivery type:  Vaginal, Spontaneous     Baby Feeding: Breast Disposition:home with mother   03/12/2018 Federico FlakeKimberly Niles Aneka Fagerstrom, MD

## 2018-04-07 ENCOUNTER — Ambulatory Visit: Payer: Self-pay | Admitting: Advanced Practice Midwife

## 2018-12-07 IMAGING — US US MFM UA CORD DOPPLER
1 series · 15 of 28 positions shown · non-contrast
Comparison: none

[Series 1: us mfm ua cord doppler · 31 acquisitions, 15 frames shown]
[im 1/31]
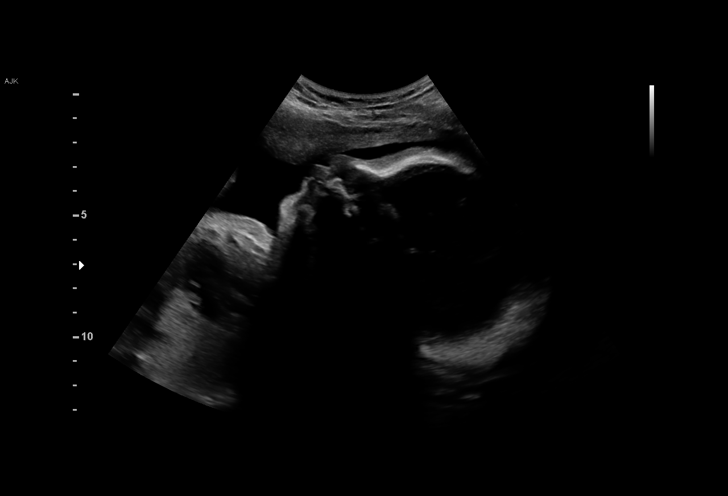
[im 3/31]
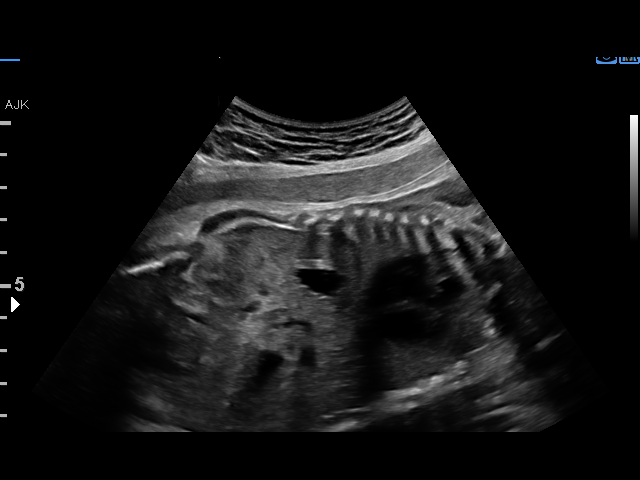
[im 5/31]
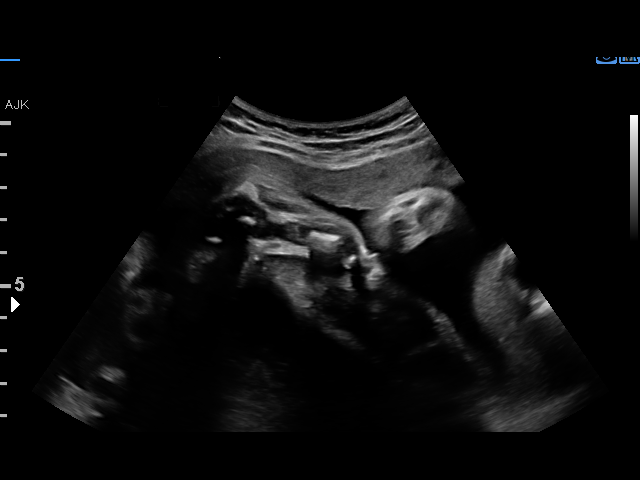
[im 7/31]
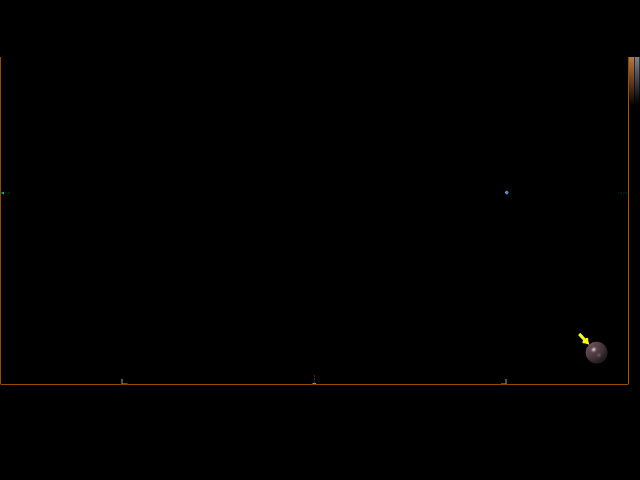
[im 9/31]
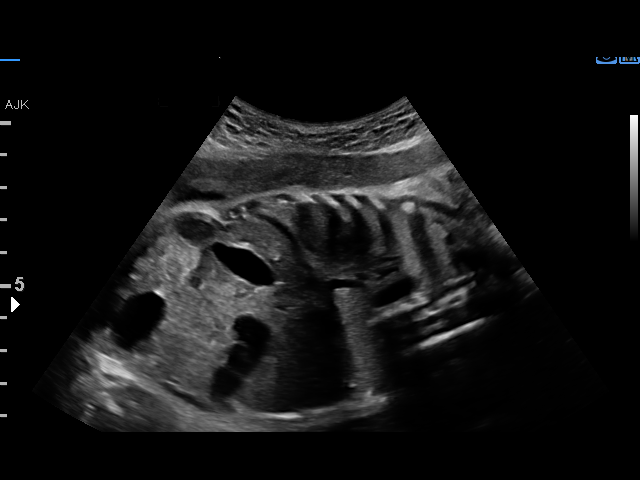
[im 12/31]
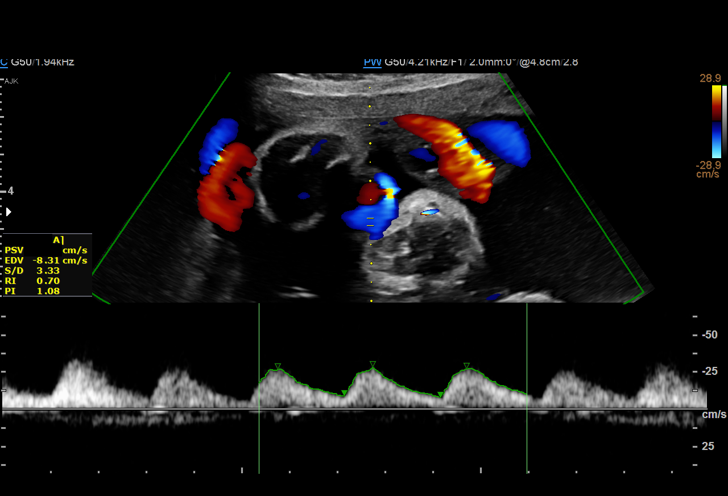
[im 14/31]
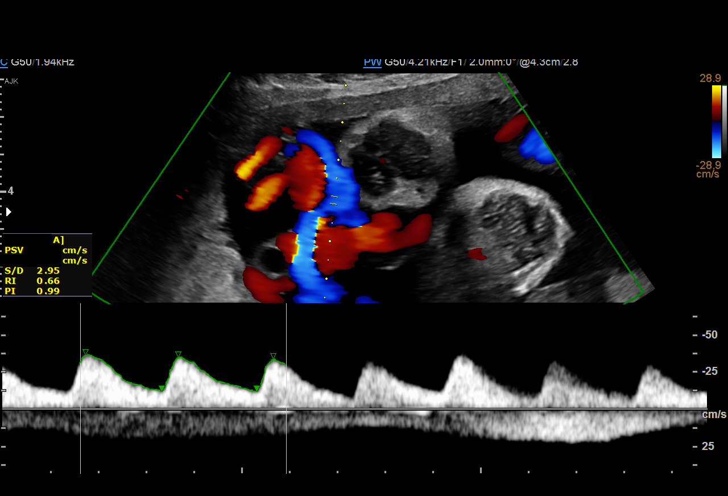
[im 16/31]
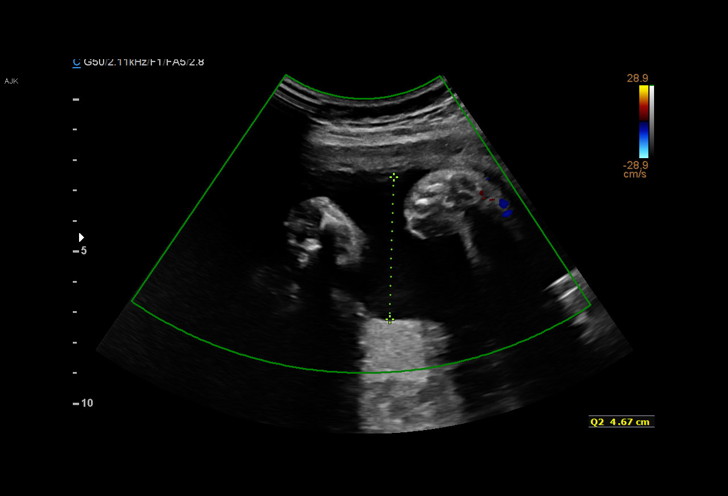
[im 17/31]
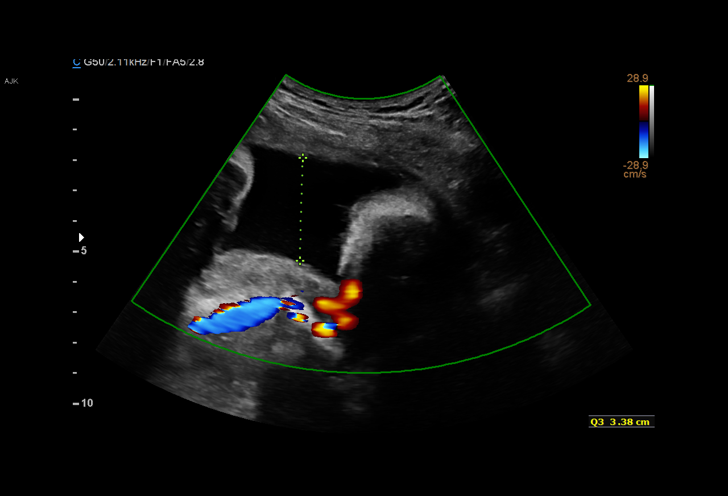
[im 19/31]
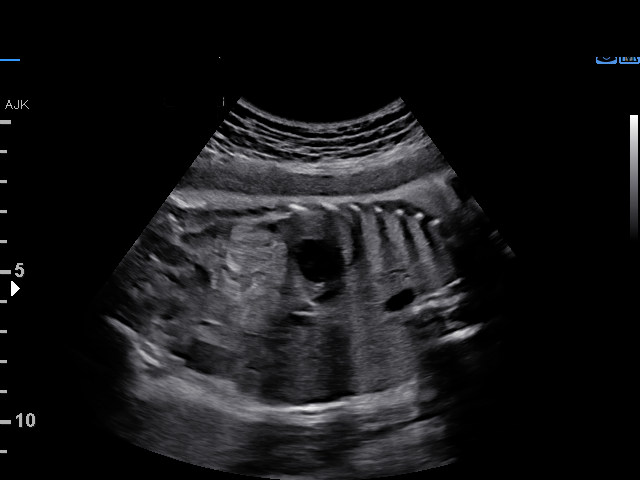
[im 22/31]
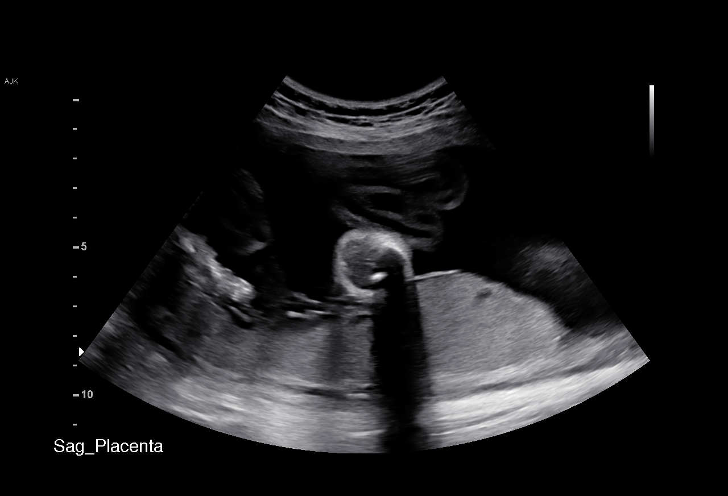
[im 24/31]
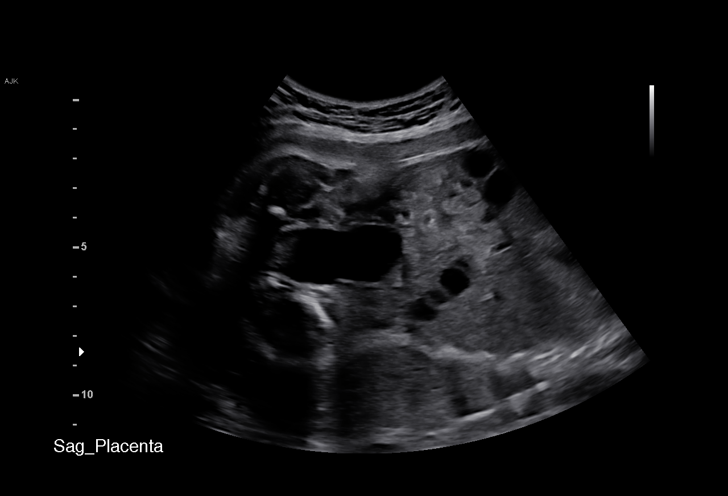
[im 26/31]
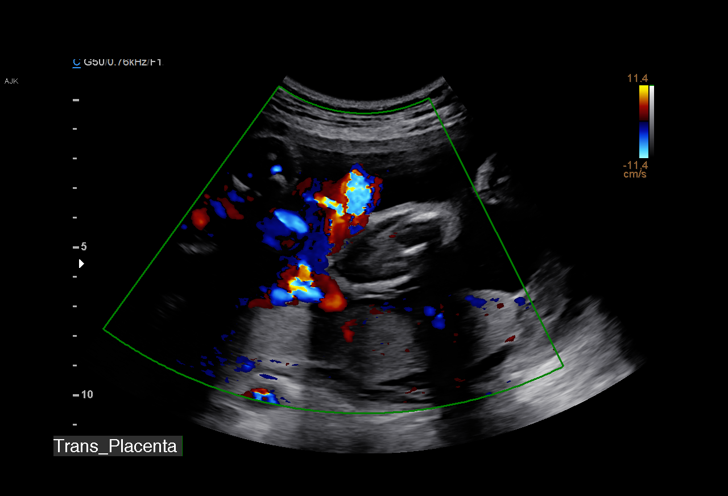
[im 28/31]
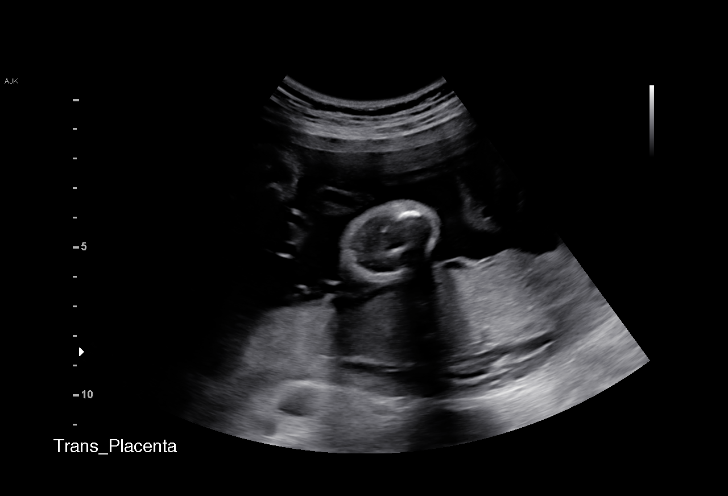
[im 31/31]
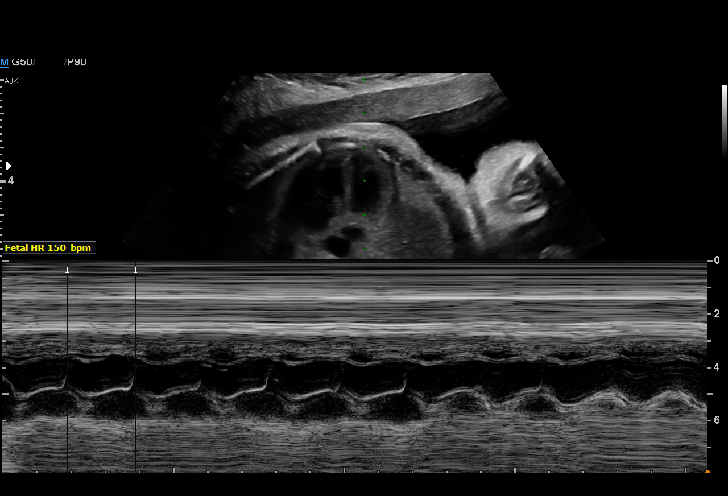

[15 of 28 positions shown; findings below may reference images not displayed]

[facility]

1  AUTOMONEY MAGADAN            [id number]      [id number]     [id number]
2  AUTOMONEY MAGADAN            [id number]      [id number]     [id number]
Indications

32 weeks gestation of pregnancy
Maternal care for known or suspected poor
fetal growth, third trimester, not applicable or
unspecified
Poor obstetric history: Previous preterm
delivery, antepartum (34 weeks)
Previous cesarean delivery, antepartum
OB History

Blood Type:            Height:  5'2"   Weight (lb):  117       BMI:
Gravidity:    3         Term:   1        Prem:   1
Living:       2
Fetal Evaluation

Num Of Fetuses:     1
Fetal Heart         150
Rate(bpm):
Cardiac Activity:   Observed
Presentation:       Cephalic
Placenta:           Posterior, above cervical os
P. Cord Insertion:  Visualized

Amniotic Fluid
AFI FV:      Subjectively within normal limits

AFI Sum(cm)     %Tile       Largest Pocket(cm)
14.83           52
RUQ(cm)       RLQ(cm)       LUQ(cm)        LLQ(cm)
3.91
Biophysical Evaluation

Amniotic F.V:   Within normal limits       F. Tone:        Observed
F. Movement:    Observed                   Score:          [DATE]
F. Breathing:   Observed
Gestational Age

LMP:           34w 3d        Date:  05/06/17                 EDD:   02/10/18
Best:          32w 1d     Det. By:  Previous Ultrasound      EDD:   02/26/18
(09/01/17)
Doppler - Fetal Vessels

Umbilical Artery
S/D     %tile     RI              PI              PSV    ADFV    RDFV
(cm/s)
3.37       82     0.7            1.11             35.41      No      No

Impression

SIUP at 32+1 weeks
Normal amniotic fluid volume
BPP [DATE]
UA dopplers were normal for this GA (82nd %tile)
Recommendations

Continue weekly testing
Growth US in 2 weeks

## 2019-01-04 IMAGING — US US FETAL BPP W/ NON-STRESS
1 series · 13 of 13 positions shown · non-contrast
Comparison: none

[Series 1: us fetal bpp w/nonstress · 13 acquisitions, 13 frames shown]
[im 1/13]
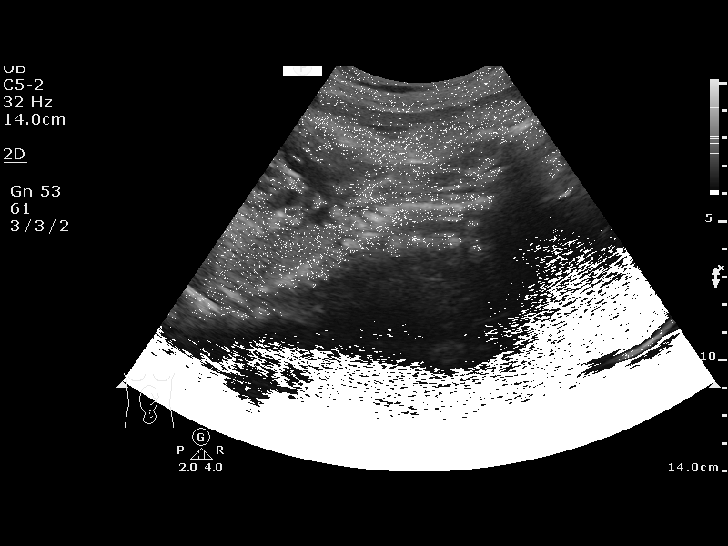
[im 2/13]
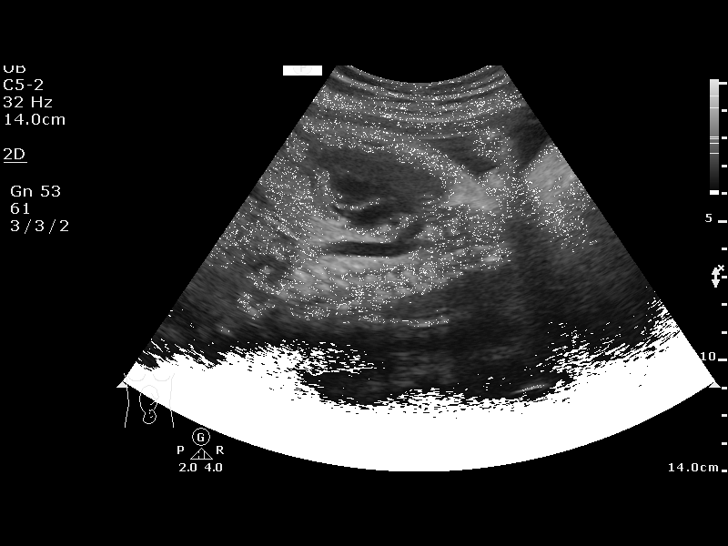
[im 3/13]
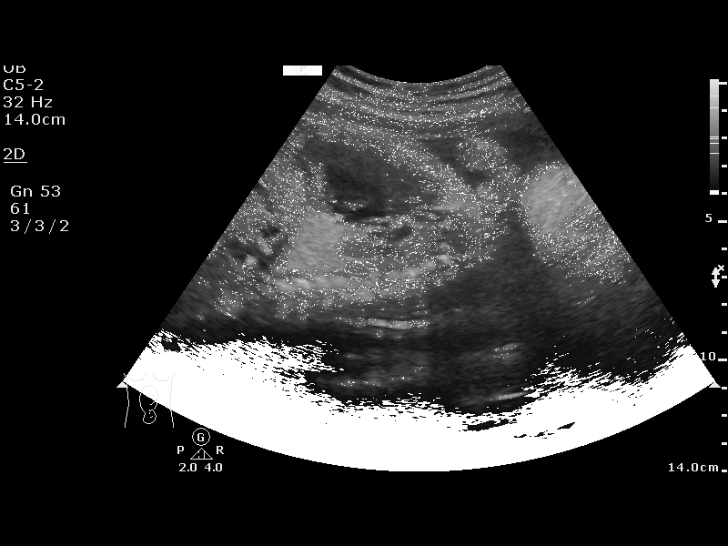
[im 4/13]
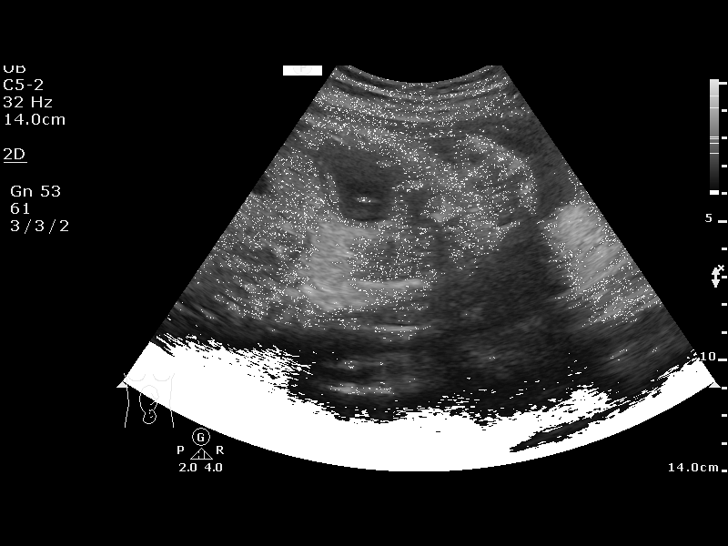
[im 5/13]
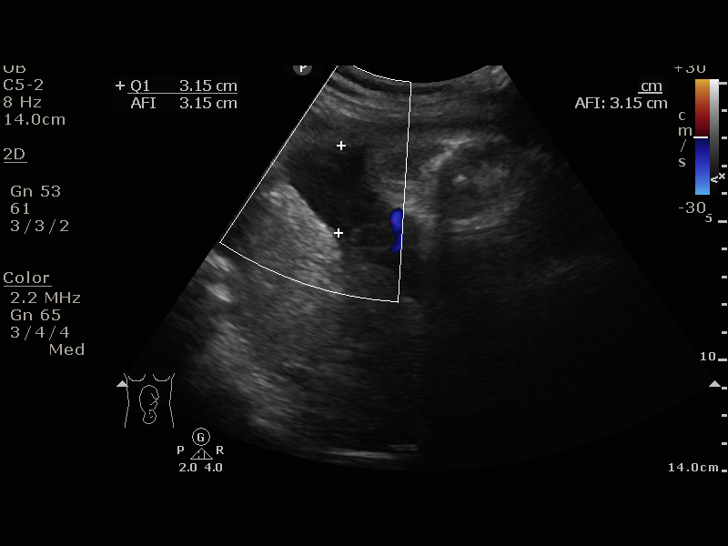
[im 6/13]
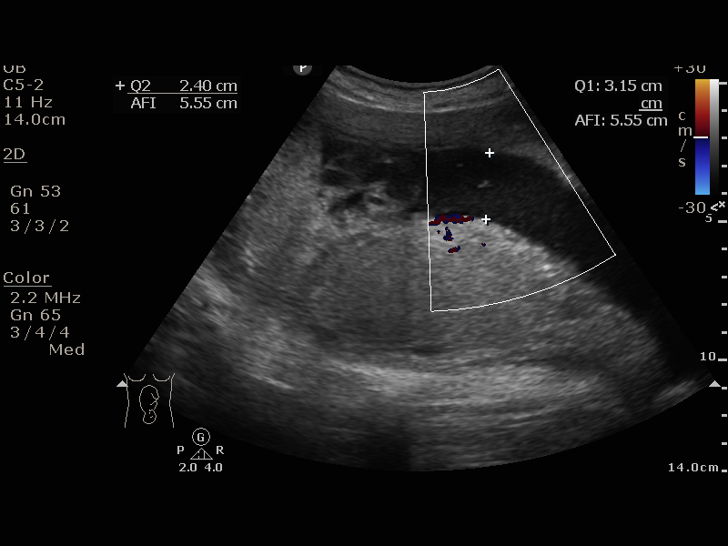
[im 7/13]
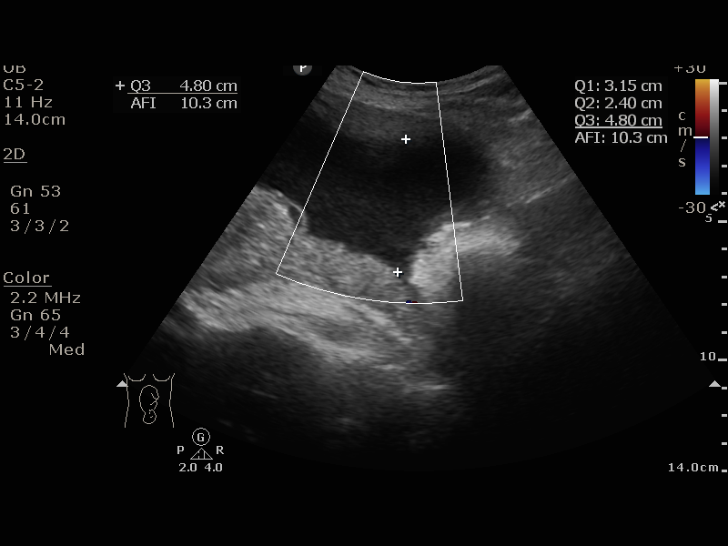
[im 8/13]
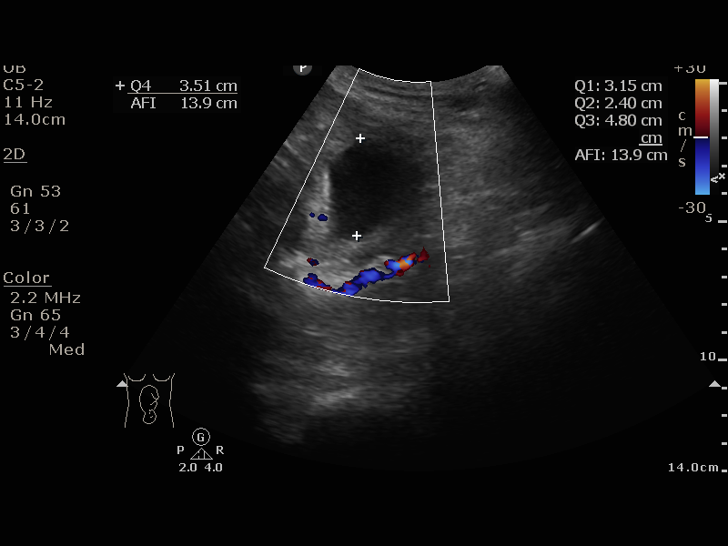
[im 9/13]
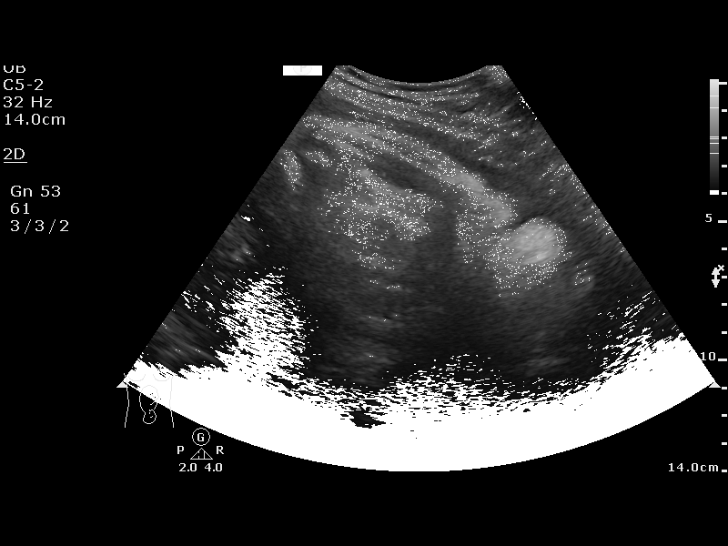
[im 10/13]
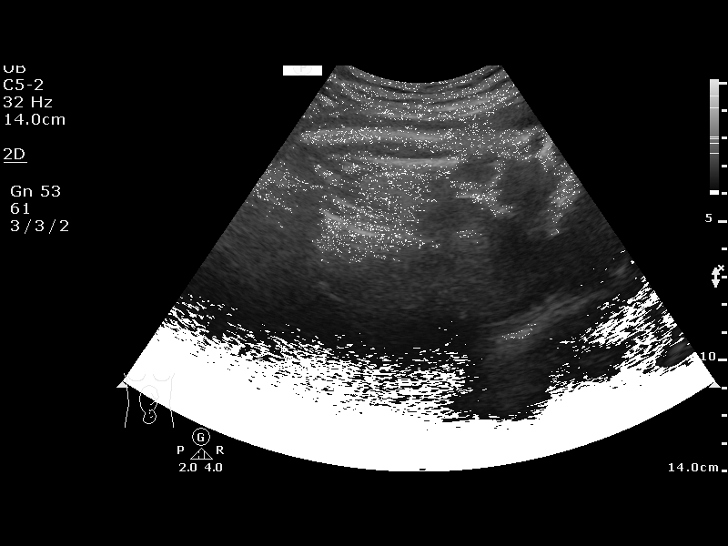
[im 11/13]
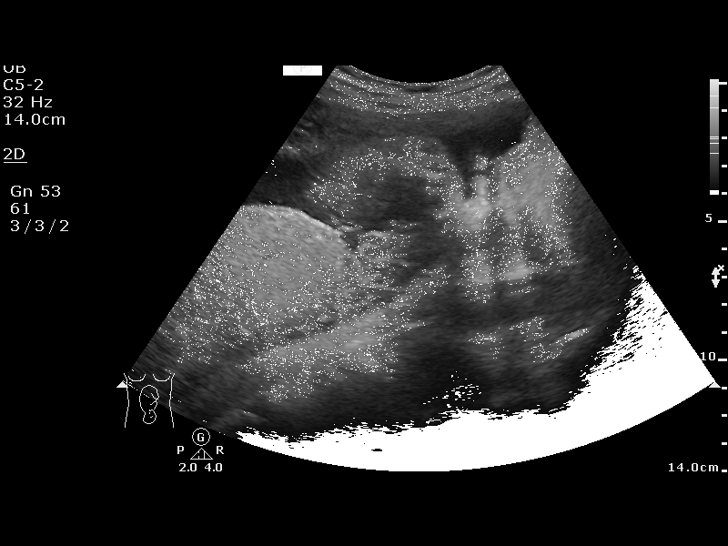
[im 12/13]
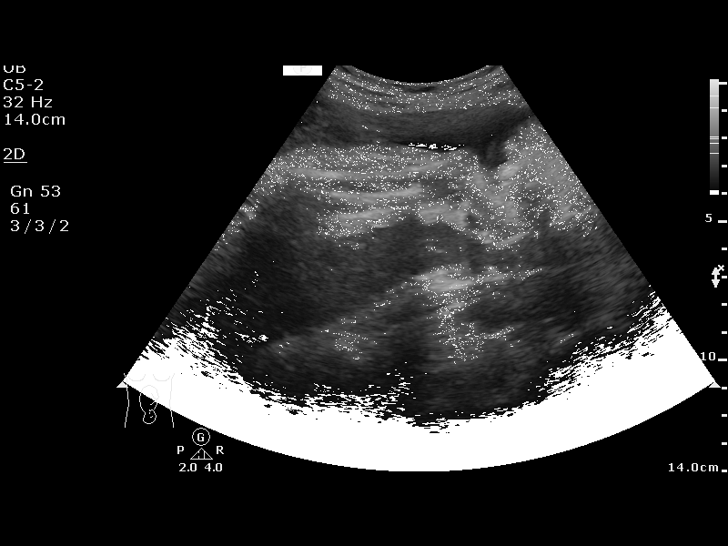
[im 13/13]
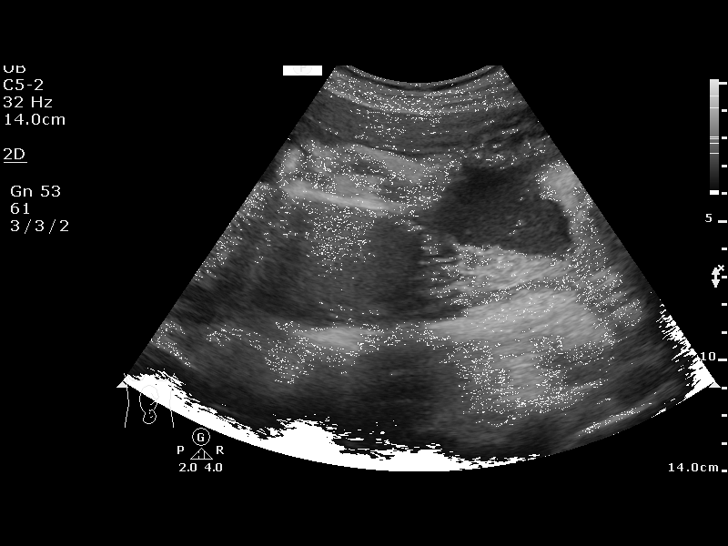

[13 of 13 positions shown; findings below may reference images not displayed]

[facility]

[facility] Clinic
Attending:        Talat Gartner         Location:         [facility]
[REDACTED]

1  US FETAL BPP W/NONSTRESS                    76818.4

1  MONIA-KELLY GA KIMOSHORI           [id number]      [id number]     [id number]
Service(s) Provided

Indications

36 weeks gestation of pregnancy
Small for gestational age fetus affecting
management of mother
OB History

Blood Type:            Height:  5'2"   Weight (lb):  117       BMI:
Gravidity:    3         Term:   1        Prem:   1
Living:       2
Fetal Evaluation

Num Of Fetuses:     1
Preg. Location:     Intrauterine
Cardiac Activity:   Observed
Presentation:       Cephalic

Amniotic Fluid
AFI FV:      Subjectively within normal limits

AFI Sum(cm)     %Tile       Largest Pocket(cm)
13.86           50
RUQ(cm)       RLQ(cm)       LUQ(cm)        LLQ(cm)
3.15
Biophysical Evaluation

Amniotic F.V:   Pocket => 2 cm two         F. Tone:        Observed
planes
F. Movement:    Observed                   N.S.T:          Reactive
F. Breathing:   Observed                   Score:          [DATE]
Gestational Age

LMP:           38w 3d        Date:  05/06/17                 EDD:   02/10/18
Best:          36w 1d     Det. By:  Previous Ultrasound      EDD:   02/26/18
(09/01/17)
Impression

IUP at 72w3d
Normal amniotic fluid volume
Reassuring BPP of [DATE]
Recommendations

Continue weekly antenatal testing till delivery

## 2021-07-23 NOTE — L&D Delivery Note (Addendum)
OB/GYN Faculty Practice Delivery Note  Sally Peterson is a 31 y.o. [phone] s/p spontaneous VBAC at [redacted]w[redacted]d. She was admitted for spontaneous labor.   ROM: 0h 49m with clear fluid GBS Status: Negative Maximum Maternal Temperature: 18F  Labor Progress: Uncomplicated  Delivery Date/Time: 03/26/2022 @0601  Delivery: Called to room and patient was complete and pushing. Head delivered OA, with restitution to maternal left. No nuchal cord present. Compound right arm, reduced at the perineum. Shoulder and body delivered in usual fashion. Infant with spontaneous cry, placed on mother's abdomen, dried and stimulated. Cord clamped x 2 after 1-minute delay, and cut by patients sister under my direct supervision. Cord blood drawn. Placenta delivered spontaneously with gentle cord traction. Fundus firm with massage and Pitocin. Labia, perineum, vagina, and cervix were inspected, intact. CNM present and gloved for the duration of delivery.  Placenta: complete, three vessel cord appreciated Complications: Compound right arm Lacerations: None EBL: 175 mL Analgesia: None  Postpartum Planning Message to sent to schedule follow-up  Vaccines UTD  Infant: Female  APGARs 9/9  Not yet weighed  11/9, MD PGY3 [facility], [facility]     Attestation:  I confirm that I have verified the information documented in the resident's note and that I have also personally reperformed the physical exam and all medical decision making activities.    I was gloved and present for entire delivery SVD without incident No difficulty with shoulders No lacerations  [facility], CNM

## 2021-09-02 ENCOUNTER — Encounter (HOSPITAL_COMMUNITY): Payer: Self-pay | Admitting: *Deleted

## 2021-09-02 ENCOUNTER — Inpatient Hospital Stay (HOSPITAL_COMMUNITY)
Admission: EM | Admit: 2021-09-02 | Discharge: 2021-09-03 | Disposition: A | Discharge status: Home / Self Care | Payer: Self-pay | Attending: Obstetrics and Gynecology | Admitting: Obstetrics and Gynecology

## 2021-09-02 DIAGNOSIS — Z3A11 11 weeks gestation of pregnancy: Secondary | ICD-10-CM | POA: Insufficient documentation

## 2021-09-02 DIAGNOSIS — O26891 Other specified pregnancy related conditions, first trimester: Secondary | ICD-10-CM | POA: Insufficient documentation

## 2021-09-02 DIAGNOSIS — R102 Pelvic and perineal pain: Secondary | ICD-10-CM | POA: Insufficient documentation

## 2021-09-02 DIAGNOSIS — R1030 Lower abdominal pain, unspecified: Secondary | ICD-10-CM | POA: Insufficient documentation

## 2021-09-02 DIAGNOSIS — Z3491 Encounter for supervision of normal pregnancy, unspecified, first trimester: Secondary | ICD-10-CM

## 2021-09-02 LAB — URINALYSIS, ROUTINE W REFLEX MICROSCOPIC
Bilirubin Urine: NEGATIVE
Glucose, UA: NEGATIVE mg/dL
Hgb urine dipstick: NEGATIVE
Ketones, ur: NEGATIVE mg/dL
Leukocytes,Ua: NEGATIVE
Nitrite: NEGATIVE
Protein, ur: NEGATIVE mg/dL
Specific Gravity, Urine: 1.016 (ref 1.005–1.030)
pH: 8 (ref 5.0–8.0)

## 2021-09-02 LAB — I-STAT BETA HCG BLOOD, ED (MC, WL, AP ONLY): I-stat hCG, quantitative: >2000 m[IU]/mL — ABNORMAL HIGH (ref <5)

## 2021-09-02 LAB — COMPREHENSIVE METABOLIC PANEL
ALT: 13 U/L (ref 0–44)
AST: 17 U/L (ref 15–41)
Albumin: 3.7 g/dL (ref 3.5–5.0)
Alkaline Phosphatase: 47 U/L (ref 38–126)
Anion gap: 8 (ref 5–15)
BUN: 5 mg/dL — ABNORMAL LOW (ref 6–20)
CO2: 24 mmol/L (ref 22–32)
Calcium: 9 mg/dL (ref 8.9–10.3)
Chloride: 105 mmol/L (ref 98–111)
Creatinine, Ser: 0.58 mg/dL (ref 0.44–1.00)
GFR, Estimated: >60 mL/min (ref >60)
Glucose, Bld: 101 mg/dL — ABNORMAL HIGH (ref 70–99)
Potassium: 3.2 mmol/L — ABNORMAL LOW (ref 3.5–5.1)
Sodium: 137 mmol/L (ref 135–145)
Total Bilirubin: 0.2 mg/dL — ABNORMAL LOW (ref 0.3–1.2)
Total Protein: 6.7 g/dL (ref 6.5–8.1)

## 2021-09-02 LAB — CBC
HCT: 33.5 % — ABNORMAL LOW (ref 36.0–46.0)
Hemoglobin: 10.9 g/dL — ABNORMAL LOW (ref 12.0–15.0)
MCH: 29.1 pg (ref 26.0–34.0)
MCHC: 32.5 g/dL (ref 30.0–36.0)
MCV: 89.3 fL (ref 80.0–100.0)
Platelets: 274 10*3/uL (ref 150–400)
RBC: 3.75 MIL/uL — ABNORMAL LOW (ref 3.87–5.11)
RDW: 13.1 % (ref 11.5–15.5)
WBC: 6.9 10*3/uL (ref 4.0–10.5)
nRBC: 0 % (ref 0.0–0.2)

## 2021-09-02 LAB — OB RESULTS CONSOLE GC/CHLAMYDIA
Chlamydia: NEGATIVE
Neisseria Gonorrhea: NEGATIVE

## 2021-09-02 LAB — LIPASE, BLOOD: Lipase: 36 U/L (ref 11–51)

## 2021-09-02 NOTE — ED Provider Notes (Signed)
Emergency Medicine Provider OB Triage Evaluation Note  Sally Peterson is a 31 y.o. female, [phone]  at 10 weeks 2 days by LMP on 06/22/2021 who presents to the emergency department with complaints of abdominal pain/pelvic pain.  She denies any vaginal bleeding.  She reports the pain is over her abdomen now but is worse on the left lower quadrant.  No prenatal care so far this pregnancy.  She hasn't had any ultrasound so far this pregnancy.    Physical Exam  BP (!) 148/97 (BP Location: Right Arm)    Pulse (!) 119    Temp 98.7 F (37.1 C) (Oral)    Resp 15    SpO2 100%  General: Awake, no distress  HEENT: Atraumatic  Resp: Normal effort  Cardiac: Normal rate Abd: Nondistended, nontender  MSK: Moves all extremities without difficulty Neuro: Speech clear  Medical Decision Making  Pt evaluated for pregnancy concern and is stable for transfer to MAU. Pt is in agreement with plan for transfer.  10:57 PM Discussed with MAU APP who accepts patient in transfer.  Clinical Impression   1. Pelvic pain affecting pregnancy in first trimester, antepartum        Sally Peterson 09/02/21 2300    Sally Kaplan, MD 09/02/21 [phone]

## 2021-09-02 NOTE — ED Triage Notes (Signed)
Pt c/o upper abdominal pain with nausea x 1 month, but worse today. LMP 06/22/2021, ~[redacted] weeks pregnant, has not had prenatal care or OB care yet.

## 2021-09-03 ENCOUNTER — Inpatient Hospital Stay (HOSPITAL_COMMUNITY): Payer: Self-pay

## 2021-09-03 ENCOUNTER — Encounter (HOSPITAL_COMMUNITY): Payer: Self-pay | Admitting: *Deleted

## 2021-09-03 LAB — CBC WITH DIFFERENTIAL/PLATELET
Abs Immature Granulocytes: 0.02 10*3/uL (ref 0.00–0.07)
Basophils Absolute: 0.1 10*3/uL (ref 0.0–0.1)
Basophils Relative: 1 %
Eosinophils Absolute: 0.1 10*3/uL (ref 0.0–0.5)
Eosinophils Relative: 1 %
HCT: 32.8 % — ABNORMAL LOW (ref 36.0–46.0)
Hemoglobin: 11 g/dL — ABNORMAL LOW (ref 12.0–15.0)
Immature Granulocytes: 0 %
Lymphocytes Relative: 22 %
Lymphs Abs: 1.6 10*3/uL (ref 0.7–4.0)
MCH: 29.6 pg (ref 26.0–34.0)
MCHC: 33.5 g/dL (ref 30.0–36.0)
MCV: 88.4 fL (ref 80.0–100.0)
Monocytes Absolute: 0.8 10*3/uL (ref 0.1–1.0)
Monocytes Relative: 11 %
Neutro Abs: 4.5 10*3/uL (ref 1.7–7.7)
Neutrophils Relative %: 65 %
Platelets: 278 10*3/uL (ref 150–400)
RBC: 3.71 MIL/uL — ABNORMAL LOW (ref 3.87–5.11)
RDW: 13.2 % (ref 11.5–15.5)
WBC: 7 10*3/uL (ref 4.0–10.5)
nRBC: 0 % (ref 0.0–0.2)

## 2021-09-03 LAB — WET PREP, GENITAL
Clue Cells Wet Prep HPF POC: NONE SEEN
Sperm: NONE SEEN
Trich, Wet Prep: NONE SEEN
WBC, Wet Prep HPF POC: <10 (ref <10)
Yeast Wet Prep HPF POC: NONE SEEN

## 2021-09-03 LAB — HIV ANTIBODY (ROUTINE TESTING W REFLEX): HIV Screen 4th Generation wRfx: NONREACTIVE

## 2021-09-03 LAB — HCG, QUANTITATIVE, PREGNANCY: hCG, Beta Chain, Quant, S: 127333 m[IU]/mL — ABNORMAL HIGH (ref <5)

## 2021-09-03 NOTE — MAU Provider Note (Signed)
History     CSN: [id number]  Arrival date and time: 09/02/21 2215   None     Chief Complaint  Patient presents with   Abdominal Pain   HPI  Ms.Sally Peterson is a 31 y.o. female [phone] [birth year]@ [redacted]w[redacted]d here in MAU with complaints of lower abdominal pain. The pain comes and goes. The pain started 1 month ago. The pain has not been constant. She has not tried anything for the pain. She currently rates her pain 6/10. No bleeding.   Spanish interpretor used.   OB History     Gravida  4   Para  3   Term  2   Preterm  1   AB      Living  3      SAB      IAB      Ectopic      Multiple  0   Live Births  3           Past Medical History:  Diagnosis Date   Medical history non-contributory     Past Surgical History:  Procedure Laterality Date   CESAREAN SECTION      No family history on file.  Social History   Tobacco Use   Smoking status: Never   Smokeless tobacco: Never  Substance Use Topics   Alcohol use: No   Drug use: No    Allergies:  Allergies  Allergen Reactions   Penicillins Hives and Shortness Of Breath    Has patient had a PCN reaction causing immediate rash, facial/tongue/throat swelling, SOB or lightheadedness with hypotension: Unknown Has patient had a PCN reaction causing severe rash involving mucus membranes or skin necrosis: Unknown Has patient had a PCN reaction that required hospitalization: Unknown Has patient had a PCN reaction occurring within the last 10 years: Unknown If all of the above answers are "NO", then may proceed with Cephalosporin use.     Medications Prior to Admission  Medication Sig Dispense Refill Last Dose   Prenatal Vit-Fe Fumarate-FA (PRENATAL COMPLETE) 14-0.4 MG TABS Take 1 capsule by mouth daily. 60 each 0 09/03/2021   coconut oil OIL Apply 1 application topically as needed. 1 Bottle 0    ibuprofen (ADVIL,MOTRIN) 600 MG tablet Take 1 tablet (600 mg total) by mouth every 6 (six) hours. 30 tablet 0     Results for orders placed or performed during the hospital encounter of 09/02/21 (from the past 48 hour(s))  Lipase, blood     Status: None   Collection Time: 09/02/21 10:31 PM  Result Value Ref Range   Lipase 36 11 - 51 U/L    Comment: Performed at [facility], [address] [place], [place] [address]  Comprehensive metabolic panel     Status: Abnormal   Collection Time: 09/02/21 10:31 PM  Result Value Ref Range   Sodium 137 135 - 145 mmol/L   Potassium 3.2 (L) 3.5 - 5.1 mmol/L   Chloride 105 98 - 111 mmol/L   CO2 24 22 - 32 mmol/L   Glucose, Bld 101 (H) 70 - 99 mg/dL    Comment: Glucose reference range applies only to samples taken after fasting for at least 8 hours.   BUN 5 (L) 6 - 20 mg/dL   Creatinine, Ser 4.26 0.44 - 1.00 mg/dL   Calcium 9.0 8.9 - 83.4 mg/dL   Total Protein 6.7 6.5 - 8.1 g/dL   Albumin 3.7 3.5 - 5.0 g/dL   AST 17 15 -  41 U/L   ALT 13 0 - 44 U/L   Alkaline Phosphatase 47 38 - 126 U/L   Total Bilirubin 0.2 (L) 0.3 - 1.2 mg/dL   GFR, Estimated >62 >83 mL/min    Comment: (NOTE) Calculated using the CKD-EPI Creatinine Equation (2021)    Anion gap 8 5 - 15    Comment: Performed at [facility], [address] [place], [place] [address]  CBC     Status: Abnormal   Collection Time: 09/02/21 10:31 PM  Result Value Ref Range   WBC 6.9 4.0 - 10.5 K/uL   RBC 3.75 (L) 3.87 - 5.11 MIL/uL   Hemoglobin 10.9 (L) 12.0 - 15.0 g/dL   HCT 16.0 (L) 73.7 - 10.6 %   MCV 89.3 80.0 - 100.0 fL   MCH 29.1 26.0 - 34.0 pg   MCHC 32.5 30.0 - 36.0 g/dL   RDW 26.9 48.5 - 46.2 %   Platelets 274 150 - 400 K/uL   nRBC 0.0 0.0 - 0.2 %    Comment: Performed at [facility], [address], [place], [place] [address]  Urinalysis, Routine w reflex microscopic Urine, Clean Catch     Status: Abnormal   Collection Time: 09/02/21 10:31 PM  Result Value Ref Range   Color, Urine YELLOW YELLOW   APPearance CLOUDY (A) CLEAR   Specific Gravity, Urine 1.016 1.005 -  1.030   pH 8.0 5.0 - 8.0   Glucose, UA NEGATIVE NEGATIVE mg/dL   Hgb urine dipstick NEGATIVE NEGATIVE   Bilirubin Urine NEGATIVE NEGATIVE   Ketones, ur NEGATIVE NEGATIVE mg/dL   Protein, ur NEGATIVE NEGATIVE mg/dL   Nitrite NEGATIVE NEGATIVE   Leukocytes,Ua NEGATIVE NEGATIVE    Comment: Performed at [facility], [address], [place], [place] [address]  I-Stat beta hCG blood, ED     Status: Abnormal   Collection Time: 09/02/21 10:37 PM  Result Value Ref Range   I-stat hCG, quantitative >2,000.0 (H) <5 mIU/mL   Comment 3            Comment:   GEST. AGE      CONC.  (mIU/mL)   <=1 WEEK        5 - 50     2 WEEKS       50 - 500     3 WEEKS       100 - 10,000     4 WEEKS     1,000 - 30,000        FEMALE AND NON-PREGNANT FEMALE:     LESS THAN 5 mIU/mL   CBC with Differential/Platelet     Status: Abnormal   Collection Time: 09/02/21 11:45 PM  Result Value Ref Range   WBC 7.0 4.0 - 10.5 K/uL   RBC 3.71 (L) 3.87 - 5.11 MIL/uL   Hemoglobin 11.0 (L) 12.0 - 15.0 g/dL   HCT 82.9 (L) 93.7 - 16.9 %   MCV 88.4 80.0 - 100.0 fL   MCH 29.6 26.0 - 34.0 pg   MCHC 33.5 30.0 - 36.0 g/dL   RDW 67.8 93.8 - 10.1 %   Platelets 278 150 - 400 K/uL   nRBC 0.0 0.0 - 0.2 %   Neutrophils Relative % 65 %   Neutro Abs 4.5 1.7 - 7.7 K/uL   Lymphocytes Relative 22 %   Lymphs Abs 1.6 0.7 - 4.0 K/uL   Monocytes Relative 11 %   Monocytes Absolute 0.8 0.1 - 1.0 K/uL  Eosinophils Relative 1 %   Eosinophils Absolute 0.1 0.0 - 0.5 K/uL   Basophils Relative 1 %   Basophils Absolute 0.1 0.0 - 0.1 K/uL   Immature Granulocytes 0 %   Abs Immature Granulocytes 0.02 0.00 - 0.07 K/uL    Comment: Performed at [facility], [address] [place], [place] [address]  hCG, quantitative, pregnancy     Status: Abnormal   Collection Time: 09/02/21 11:45 PM  Result Value Ref Range   hCG, Beta Chain, Quant, S 127,333 (H) <5 mIU/mL    Comment:          GEST. AGE      CONC.  (mIU/mL)   <=1 WEEK        5 -  50     2 WEEKS       50 - 500     3 WEEKS       100 - 10,000     4 WEEKS     1,000 - 30,000     5 WEEKS     3,500 - 115,000   6-8 WEEKS     12,000 - 270,000    12 WEEKS     15,000 - 220,000        FEMALE AND NON-PREGNANT FEMALE:     LESS THAN 5 mIU/mL Performed at [facility], [address] [place], [place] [address]   HIV Antibody (routine testing w rflx)     Status: None   Collection Time: 09/02/21 11:45 PM  Result Value Ref Range   HIV Screen 4th Generation wRfx Non Reactive Non Reactive    Comment: Performed at [facility], [address] [place], [place] [address]     Review of Systems  Constitutional:  Negative for fever.  Gastrointestinal:  Positive for abdominal pain.  Genitourinary:  Negative for dysuria, vaginal bleeding and vaginal discharge.  Physical Exam   Blood pressure 122/83, pulse 99, temperature 98.7 F (37.1 C), temperature source Oral, resp. rate 18, height 5\' 3"  (1.6 m), weight 52.8 kg, last menstrual period 06/22/2021, SpO2 100 %, unknown if currently breastfeeding.  Physical Exam Vitals and nursing note reviewed.  Constitutional:      General: She is not in acute distress.    Appearance: She is not ill-appearing, toxic-appearing or diaphoretic.  Pulmonary:     Effort: Pulmonary effort is normal.  Abdominal:     Tenderness: There is no abdominal tenderness. There is no guarding or rebound.  Genitourinary:    Comments: Wet prep and GC collected by RN Skin:    General: Skin is warm.  Neurological:     Mental Status: She is alert and oriented to person, place, and time.    MAU Course  Procedures     Pt informed that the ultrasound is considered a limited OB ultrasound and is not intended to be a complete ultrasound exam.  Patient also informed that the ultrasound is not being completed with the intent of assessing for fetal or placental anomalies or any pelvic abnormalities.  Explained that the purpose of todays ultrasound is to  assess for  viability.  Patient acknowledges the purpose of the exam and the limitations of the study.     MDM  + fetal hear tones via doppler  Wet prep & GC HIV, CBC, Hcg, ABO Beside 14/07/2020  Assessment and Plan   A:  1. Pelvic pain affecting pregnancy in first trimester, antepartum   2. Abdominal pain  in pregnancy, first trimester   3. [redacted] weeks gestation of pregnancy   4. Presence of fetal heart sounds in first trimester      P:  Discharge home Start prenatal care Return to MAU if symptoms worsen  Toben Acuna, Harolyn RutherfordJennifer I, NP 09/04/2021 10:17 AM

## 2021-09-03 NOTE — MAU Note (Signed)
Transfer from [facility]. Pt c/o lower abd pain and cramping on and off for about a month. Tonight the pain got a little stronger and I did not feel good. C/o nausea no vomiting. Denies any vag bleeding or discharge

## 2021-09-04 LAB — GC/CHLAMYDIA PROBE AMP (~~LOC~~) NOT AT ARMC
Chlamydia: NEGATIVE
Comment: NEGATIVE
Comment: NORMAL
Neisseria Gonorrhea: NEGATIVE

## 2021-11-16 LAB — OB RESULTS CONSOLE RUBELLA ANTIBODY, IGM: Rubella: IMMUNE

## 2021-11-16 LAB — HEPATITIS C ANTIBODY: HCV Ab: NEGATIVE

## 2021-11-16 LAB — OB RESULTS CONSOLE HEPATITIS B SURFACE ANTIGEN: Hepatitis B Surface Ag: NEGATIVE

## 2022-01-04 LAB — OB RESULTS CONSOLE RPR: RPR: NONREACTIVE

## 2022-03-05 LAB — OB RESULTS CONSOLE GBS: GBS: NEGATIVE

## 2022-03-26 ENCOUNTER — Other Ambulatory Visit: Payer: Self-pay

## 2022-03-26 ENCOUNTER — Inpatient Hospital Stay (HOSPITAL_COMMUNITY)
Admission: AD | Admit: 2022-03-26 | Discharge: 2022-03-27 | DRG: 807 | Disposition: A | Discharge status: Home / Self Care | Payer: Medicaid Other | Attending: Obstetrics & Gynecology | Admitting: Obstetrics & Gynecology

## 2022-03-26 ENCOUNTER — Encounter (HOSPITAL_COMMUNITY): Payer: Self-pay | Admitting: Obstetrics & Gynecology

## 2022-03-26 DIAGNOSIS — Z3A39 39 weeks gestation of pregnancy: Secondary | ICD-10-CM

## 2022-03-26 DIAGNOSIS — O26893 Other specified pregnancy related conditions, third trimester: Secondary | ICD-10-CM | POA: Diagnosis present

## 2022-03-26 DIAGNOSIS — O365930 Maternal care for other known or suspected poor fetal growth, third trimester, not applicable or unspecified: Principal | ICD-10-CM | POA: Diagnosis present

## 2022-03-26 DIAGNOSIS — O326XX0 Maternal care for compound presentation, not applicable or unspecified: Secondary | ICD-10-CM | POA: Diagnosis present

## 2022-03-26 DIAGNOSIS — O479 False labor, unspecified: Secondary | ICD-10-CM | POA: Diagnosis present

## 2022-03-26 DIAGNOSIS — O34219 Maternal care for unspecified type scar from previous cesarean delivery: Secondary | ICD-10-CM | POA: Diagnosis present

## 2022-03-26 DIAGNOSIS — O34211 Maternal care for low transverse scar from previous cesarean delivery: Secondary | ICD-10-CM | POA: Diagnosis not present

## 2022-03-26 LAB — CBC
HCT: 38.1 % (ref 36.0–46.0)
Hemoglobin: 12.5 g/dL (ref 12.0–15.0)
MCH: 31.3 pg (ref 26.0–34.0)
MCHC: 32.8 g/dL (ref 30.0–36.0)
MCV: 95.5 fL (ref 80.0–100.0)
Platelets: 282 10*3/uL (ref 150–400)
RBC: 3.99 MIL/uL (ref 3.87–5.11)
RDW: 13.7 % (ref 11.5–15.5)
WBC: 12.1 10*3/uL — ABNORMAL HIGH (ref 4.0–10.5)
nRBC: 0 % (ref 0.0–0.2)

## 2022-03-26 LAB — TYPE AND SCREEN
ABO/RH(D): O POS
Antibody Screen: NEGATIVE

## 2022-03-26 MED ORDER — ACETAMINOPHEN 325 MG PO TABS
650.0000 mg | ORAL_TABLET | ORAL | Status: DC | PRN
  Administered 2022-03-26 (×2): 650 mg via ORAL
  Filled 2022-03-26 (×2): qty 2

## 2022-03-26 MED ORDER — OXYTOCIN BOLUS FROM INFUSION
333.0000 mL | Freq: Once | INTRAVENOUS | Status: AC
  Administered 2022-03-26: 333 mL via INTRAVENOUS

## 2022-03-26 MED ORDER — BENZOCAINE-MENTHOL 20-0.5 % EX AERO: 1.0000 | INHALATION_SPRAY | CUTANEOUS | Status: DC | PRN

## 2022-03-26 MED ORDER — ONDANSETRON HCL 4 MG/2ML IJ SOLN: 4.0000 mg | INTRAMUSCULAR | Status: DC | PRN

## 2022-03-26 MED ORDER — LIDOCAINE HCL (PF) 1 % IJ SOLN: 30.0000 mL | INTRAMUSCULAR | Status: DC | PRN

## 2022-03-26 MED ORDER — OXYCODONE-ACETAMINOPHEN 5-325 MG PO TABS: 1.0000 | ORAL_TABLET | ORAL | Status: DC | PRN

## 2022-03-26 MED ORDER — LACTATED RINGERS IV SOLN: 500.0000 mL | INTRAVENOUS | Status: DC | PRN

## 2022-03-26 MED ORDER — DIBUCAINE (PERIANAL) 1 % EX OINT: 1.0000 | TOPICAL_OINTMENT | CUTANEOUS | Status: DC | PRN

## 2022-03-26 MED ORDER — PRENATAL MULTIVITAMIN CH
1.0000 | ORAL_TABLET | Freq: Every day | ORAL | Status: DC
  Administered 2022-03-26 – 2022-03-27 (×2): 1 via ORAL
  Filled 2022-03-26 (×2): qty 1

## 2022-03-26 MED ORDER — IBUPROFEN 600 MG PO TABS
600.0000 mg | ORAL_TABLET | Freq: Four times a day (QID) | ORAL | Status: DC
  Administered 2022-03-26 – 2022-03-27 (×6): 600 mg via ORAL
  Filled 2022-03-26 (×5): qty 1

## 2022-03-26 MED ORDER — WITCH HAZEL-GLYCERIN EX PADS: 1.0000 | MEDICATED_PAD | CUTANEOUS | Status: DC | PRN

## 2022-03-26 MED ORDER — OXYTOCIN-SODIUM CHLORIDE 30-0.9 UT/500ML-% IV SOLN
2.5000 [IU]/h | INTRAVENOUS | Status: DC
  Filled 2022-03-26: qty 500

## 2022-03-26 MED ORDER — SIMETHICONE 80 MG PO CHEW: 80.0000 mg | CHEWABLE_TABLET | ORAL | Status: DC | PRN

## 2022-03-26 MED ORDER — ZOLPIDEM TARTRATE 5 MG PO TABS: 5.0000 mg | ORAL_TABLET | Freq: Every evening | ORAL | Status: DC | PRN

## 2022-03-26 MED ORDER — SOD CITRATE-CITRIC ACID 500-334 MG/5ML PO SOLN: 30.0000 mL | ORAL | Status: DC | PRN

## 2022-03-26 MED ORDER — SENNOSIDES-DOCUSATE SODIUM 8.6-50 MG PO TABS
2.0000 | ORAL_TABLET | ORAL | Status: DC
  Administered 2022-03-26 – 2022-03-27 (×2): 2 via ORAL
  Filled 2022-03-26 (×3): qty 2

## 2022-03-26 MED ORDER — COCONUT OIL OIL: 1.0000 | TOPICAL_OIL | Status: DC | PRN

## 2022-03-26 MED ORDER — ONDANSETRON HCL 4 MG PO TABS: 4.0000 mg | ORAL_TABLET | ORAL | Status: DC | PRN

## 2022-03-26 MED ORDER — ACETAMINOPHEN 325 MG PO TABS: 650.0000 mg | ORAL_TABLET | ORAL | Status: DC | PRN

## 2022-03-26 MED ORDER — LACTATED RINGERS IV SOLN: INTRAVENOUS | Status: DC

## 2022-03-26 MED ORDER — DIPHENHYDRAMINE HCL 25 MG PO CAPS: 25.0000 mg | ORAL_CAPSULE | Freq: Four times a day (QID) | ORAL | Status: DC | PRN

## 2022-03-26 MED ORDER — TETANUS-DIPHTH-ACELL PERTUSSIS 5-2.5-18.5 LF-MCG/0.5 IM SUSY: 0.5000 mL | PREFILLED_SYRINGE | Freq: Once | INTRAMUSCULAR | Status: DC

## 2022-03-26 MED ORDER — ONDANSETRON HCL 4 MG/2ML IJ SOLN: 4.0000 mg | Freq: Four times a day (QID) | INTRAMUSCULAR | Status: DC | PRN

## 2022-03-26 MED ORDER — OXYCODONE-ACETAMINOPHEN 5-325 MG PO TABS: 2.0000 | ORAL_TABLET | ORAL | Status: DC | PRN

## 2022-03-26 NOTE — Lactation Note (Signed)
This note was copied from a baby's chart. Lactation Consultation Note  Patient Name: Girl Reaghan Kawa HERDE'Y Date: 03/26/2022 Reason for consult: Initial assessment;Term;Breastfeeding assistance Age:29 hours  Spanish Interpreter Roanna Banning #[id number] iPad used   P4, Term, Infant Female  Infant at 5 hours  LC entered the room and baby was asleep STS with birth parent.  She states that breastfeeding is going well.  The birth parent denies any pain or discomfort when breastfeeding.  Birth parent breast fed for 4 months with older children.  She does not have a pump at home and asked for a manual breast pump.  LC gave birth parent a manual pump.  She does not want a referral sent to [facility], but she states that she has WIC.  She states that she has no questions or concerns.  Current Feeding Plan:  Breastfeed 8+ times in 24 hours according to feeding cues.  Hand express and feed expressed milk to baby via a spoon.  Call RN/LC for assistance with breastfeeding.   Maternal Data Has patient been taught Hand Expression?: Yes Does the patient have breastfeeding experience prior to this delivery?: Yes How long did the patient breastfeed?: 4 months  Feeding Mother's Current Feeding Choice: Breast Milk and Formula  LATCH Score Latch: Grasps breast easily, tongue down, lips flanged, rhythmical sucking.  Audible Swallowing: A few with stimulation  Type of Nipple: Everted at rest and after stimulation  Comfort (Breast/Nipple): Soft / non-tender  Hold (Positioning): No assistance needed to correctly position infant at breast.  LATCH Score: 9   Lactation Tools Discussed/Used Tools: Pump Breast pump type: Manual Reason for Pumping: Birth parent's request; she has no pump at home  Interventions Interventions: LC Services brochure;Hand pump  Discharge Pump: Manual;Personal  Consult Status Consult Status: Follow-up Date: 03/27/22 Follow-up type: In-patient    Orvil Feil  Madai Nuccio 03/26/2022, 11:19 AM

## 2022-03-26 NOTE — MAU Note (Signed)
.  Sally Peterson is a 31 y.o. at [redacted]w[redacted]d here in MAU reporting: Ctx that started at 0200 4 min apart. NO lOF,bloody show, +FM.  129/79 Bp.   Pain score: 9/10 ctx pain   FHT: 135  Lab orders placed from triage:   labor eval.

## 2022-03-26 NOTE — Discharge Summary (Addendum)
Postpartum Discharge Summary  Date of Service updated 03/27/2022      Patient Name: Sally Peterson DOB: 1991/06/08 MRN: [id number]  Date of admission: 03/26/2022 Delivery date:03/26/2022  Delivering provider: Seabron Spates  Date of discharge: 03/27/2022  Admitting diagnosis: Uterine contractions during pregnancy [O47.9] Intrauterine pregnancy: [redacted]w[redacted]d    Secondary diagnosis:  Principal Problem:   Uterine contractions during pregnancy Active Problems:   Vaginal delivery  Additional problems: n/a    Discharge diagnosis: Term Pregnancy Delivered                                              Post partum procedures: n/a Augmentation: N/A Complications: None  Hospital course: Onset of Labor With Vaginal Delivery      [name] admitted in Active Labor on 03/26/2022. Patient had an uncomplicated labor course as follows:  Membrane Rupture Time/Date: 5:49 AM ,03/26/2022   Delivery Method:VBAC, Spontaneous  Episiotomy: None  Lacerations:  None  Patient had an uncomplicated postpartum course.  She is ambulating, tolerating a regular diet, passing flatus, and urinating well. Patient is discharged home in stable condition on 03/27/22.  Newborn Data: Birth date:03/26/2022  Birth time:6:01 AM  Gender:Female  Living status:Living  Apgars:9 ,9  Weight:3665 g   Magnesium Sulfate received: No BMZ received: No Rhophylac:No MMR:No T-DaP: not given Flu: No Transfusion:No  Physical exam  Vitals:   03/26/22 0900 03/26/22 1305 03/26/22 1621 03/26/22 2149  BP: 115/65 120/77 115/82 104/67  Pulse: 88 72 68 69  Resp: 16 16 17 18   Temp: 98.3 F (36.8 C) 98.6 F (37 C) 98 F (36.7 C) 98.2 F (36.8 C)  TempSrc: Oral Oral Oral Oral  SpO2: 99% 100%     General: alert, cooperative, and no distress Lochia: appropriate Uterine Fundus: firm Incision: N/A DVT Evaluation: No evidence of DVT seen on physical exam. No significant calf/ankle edema. Labs: Lab  Results  Component Value Date   WBC 12.1 (H) 03/26/2022   HGB 12.5 03/26/2022   HCT 38.1 03/26/2022   MCV 95.5 03/26/2022   PLT 282 03/26/2022      Latest Ref Rng & Units 09/02/2021   10:31 PM  CMP  Glucose 70 - 99 mg/dL 101   BUN 6 - 20 mg/dL 5   Creatinine 0.44 - 1.00 mg/dL 0.58   Sodium 135 - 145 mmol/L 137   Potassium 3.5 - 5.1 mmol/L 3.2   Chloride 98 - 111 mmol/L 105   CO2 22 - 32 mmol/L 24   Calcium 8.9 - 10.3 mg/dL 9.0   Total Protein 6.5 - 8.1 g/dL 6.7   Total Bilirubin 0.3 - 1.2 mg/dL 0.2   Alkaline Phos 38 - 126 U/L 47   AST 15 - 41 U/L 17   ALT 0 - 44 U/L 13    Edinburgh Score:    03/26/2022    6:25 PM  Edinburgh Postnatal Depression Scale Screening Tool  I have been able to laugh and see the funny side of things. 0  I have looked forward with enjoyment to things. 0  I have blamed myself unnecessarily when things went wrong. 0  I have been anxious or worried for no good reason. 0  I have felt scared or panicky for no good reason. 0  Things have been getting on top of me.  1  I have been so unhappy that I have had difficulty sleeping. 0  I have felt sad or miserable. 0  I have been so unhappy that I have been crying. 0  The thought of harming myself has occurred to me. 0  Edinburgh Postnatal Depression Scale Total 1      After visit meds:  Allergies as of 03/27/2022       Reactions   Penicillins Hives, Shortness Of Breath   Has patient had a PCN reaction causing immediate rash, facial/tongue/throat swelling, SOB or lightheadedness with hypotension: Unknown Has patient had a PCN reaction causing severe rash involving mucus membranes or skin necrosis: Unknown Has patient had a PCN reaction that required hospitalization: Unknown Has patient had a PCN reaction occurring within the last 10 years: Unknown If all of the above answers are "NO", then may proceed with Cephalosporin use.        Medication List     STOP taking these medications    ferrous  sulfate 325 (65 FE) MG tablet       TAKE these medications    acetaminophen 325 MG tablet Commonly known as: Tylenol Take 2 tablets (650 mg total) by mouth every 6 (six) hours as needed.   coconut oil Oil Apply 1 application topically as needed.   ibuprofen 600 MG tablet Commonly known as: ADVIL Take 1 tablet (600 mg total) by mouth every 6 (six) hours.   Prenatal Complete 14-0.4 MG Tabs Take 1 capsule by mouth daily. What changed: how much to take         Discharge home in stable condition Infant Feeding: Bottle and Breast Infant Disposition:home with mother Discharge instruction: per After Visit Summary and Postpartum booklet. Activity: Advance as tolerated. Pelvic rest for 6 weeks.  Diet: routine diet Anticipated Birth Control: Unsure Postpartum Appointment:4 weeks Additional Postpartum F/U: N/a Future Appointments:No future appointments. Follow up Visit:     Sally Schneiders, MD Resident Physician 03/27/2022   Attestation:  I confirm that I have verified the information documented in the resident's note and that I have also personally reperformed the physical exam and all medical decision making activities.   Patient was seen and examined by me also Agree with note Vitals stable Labs stable Fundus firm, lochia within normal limits Perineum healing Ext WNL Continue care  Ready for discharge  Seabron Spates, CNM

## 2022-03-26 NOTE — Progress Notes (Signed)
Ordered pt meals and check on needs, by Orlan Leavens Spanish Medical Interpreter.

## 2022-03-26 NOTE — H&P (Signed)
Sally Peterson is a 31 y.o. female presenting for contractions since 2am Denies leaking or bleeding  Has received care at [facility] then transferred to our clinic for IUGR.  Latest measurement was 17%ile with normal fluid Has a history of C/S Delivery for her first baby (for "cyst on the head") and then had 2 VBAC deliveries after thant.   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 OB History     Gravida  4   Para  3   Term  2   Preterm  1   AB      Living  3      SAB      IAB      Ectopic      Multiple  0   Live Births  3          Past Medical History:  Diagnosis Date   Medical history non-contributory    Past Surgical History:  Procedure Laterality Date   CESAREAN SECTION     Family History: family history is not on file. Social History:  reports that she has never smoked. She has never used smokeless tobacco. She reports that she does not drink alcohol and does not use drugs.     Maternal Diabetes: No Genetic Screening: Normal Maternal Ultrasounds/Referrals: Normal SGA  Fetal Ultrasounds or other Referrals:  None Maternal Substance Abuse:  No Significant Maternal Medications:  None Significant Maternal Lab Results:  Group B Strep negative Number of Prenatal Visits:greater than 3 verified prenatal visits Other Comments:   Prior baby with IUGR, this baby SGA   Review  of Systems  Constitutional:  Negative for chills and fever.  Respiratory:  Negative for shortness of breath.   Gastrointestinal:  Positive for abdominal pain. Negative for constipation, diarrhea, nausea and vomiting.  Genitourinary:  Positive for pelvic pain. Negative for vaginal bleeding.  Neurological:  Negative for headaches.   Maternal Medical History:  Reason for admission: Contractions.  Nausea.  Contractions: Onset was 3-5 hours ago.   Frequency: regular.   Perceived severity is strong.   Fetal activity: Perceived fetal activity is normal.   Last perceived fetal movement was within the past hour.   Prenatal complications: IUGR.   No bleeding, PIH, placental abnormality, pre-eclampsia or preterm labor.   Prenatal Complications - Diabetes: none.   Dilation: 8 Effacement (%): 100 Exam by:: RN Blood pressure 129/79, pulse (!) 116, resp. rate 18, last menstrual period 06/22/2021, unknown if currently breastfeeding. Maternal Exam:  Uterine Assessment: Contraction strength is firm.  Contraction frequency is regular.  Abdomen: Patient reports no abdominal tenderness. Fetal presentation: vertex Introitus: Normal vulva. Normal vagina.  Ferning test: not done.  Nitrazine test: not done. Amniotic fluid character: not assessed. Pelvis: adequate for delivery.   Cervix: Cervix evaluated by digital exam.     Fetal Exam Fetal Monitor Review: Mode: ultrasound.   Baseline rate: 140.  Variability: moderate (6-25 bpm).   Pattern: accelerations present  and no decelerations.   Fetal State Assessment: Category I - tracings are normal.   Physical Exam Constitutional:      General: She is not in acute distress.    Appearance: She is not ill-appearing or toxic-appearing.  HENT:     Head: Normocephalic.  Cardiovascular:     Rate and Rhythm: Normal rate.  Pulmonary:     Effort: Pulmonary effort is normal.  Abdominal:     General: There is no distension.     Tenderness: There is no  abdominal tenderness. There is no guarding.  Genitourinary:    General: Normal vulva.  Musculoskeletal:        General: Normal range of motion.     Cervical back: Normal range of motion.  Skin:    General: Skin is warm and dry.  Neurological:     General: No focal deficit present.     Mental Status: She is alert.  Psychiatric:        Mood and Affect: Mood normal.     Prenatal labs: ABO, Rh:   Antibody:   Rubella:   RPR:    HBsAg:    HIV: Non Reactive (02/11 2345)  GBS:     Assessment/Plan: Single IUP at [redacted]w[redacted]d Active Labor GBS Negative  Admit to Labor and Delivery  Routine orders Anticipate SVD   Wynelle Bourgeois 03/26/2022, 4:53 AM

## 2022-03-27 LAB — RPR: RPR Ser Ql: NONREACTIVE

## 2022-03-27 MED ORDER — ACETAMINOPHEN 325 MG PO TABS: 650.0000 mg | ORAL_TABLET | Freq: Four times a day (QID) | ORAL | 0 refills | Status: AC | PRN

## 2022-03-27 MED ORDER — IBUPROFEN 600 MG PO TABS: 600.0000 mg | ORAL_TABLET | Freq: Four times a day (QID) | ORAL | 0 refills | Status: AC

## 2022-03-27 NOTE — Lactation Note (Signed)
This note was copied from a baby's chart. Lactation Consultation Note  Patient Name: Sally Peterson [id number] Date: 03/27/2022 Reason for consult: Follow-up assessment;Term Age:29 hours   P4: Term infant at 39+4 weeks Feeing preference: Breast/Formula Weight loss: 5%  Spanish interpreter, Jacquenette Shone [phone]) used for interpretation.  "Maralyn Sago" was swaddled and awake when I arrived.  Birth parent had no questions/concerns related to breast feeding.  Last LATCH scores 8-9; voiding/stooling well.  Birth parent has our op phone number for any questions after discharge.  No support person present at this time.  Maternal Data    Feeding Nipple Type: Slow - flow  LATCH Score                    Lactation Tools Discussed/Used    Interventions    Discharge Discharge Education: Engorgement and breast care  Consult Status Consult Status: Complete Date: 03/27/22 Follow-up type: Call as needed    Paisyn Guercio R Huntley Demedeiros 03/27/2022, 10:16 AM

## 2022-04-05 ENCOUNTER — Telehealth (HOSPITAL_COMMUNITY): Payer: Self-pay | Admitting: *Deleted

## 2022-04-05 NOTE — Telephone Encounter (Signed)
Mom reports feeling good. No concerns about herself at this time. EPDS=0 [facility] score=1) Mom reports baby is doing well. Feeding, peeing, and pooping without difficulty. Safe sleep reviewed. Mom reports no concerns about baby at present.  Duffy Rhody, RN 04-05-2022 at 3:40pm
